# Patient Record
Sex: Female | Born: 1990 | Race: White | Hispanic: No | Marital: Single | Smoking: Never smoker
Health system: Southern US, Community
[De-identification: ages and names within clinical notes are randomized; demographics above are authoritative.]

## PROBLEM LIST (undated history)

## (undated) DIAGNOSIS — F329 Major depressive disorder, single episode, unspecified: Secondary | ICD-10-CM

## (undated) DIAGNOSIS — G43009 Migraine without aura, not intractable, without status migrainosus: Secondary | ICD-10-CM

## (undated) DIAGNOSIS — F32A Depression, unspecified: Secondary | ICD-10-CM

## (undated) DIAGNOSIS — L709 Acne, unspecified: Secondary | ICD-10-CM

## (undated) DIAGNOSIS — F419 Anxiety disorder, unspecified: Secondary | ICD-10-CM

## (undated) DIAGNOSIS — D649 Anemia, unspecified: Secondary | ICD-10-CM

## (undated) DIAGNOSIS — F410 Panic disorder [episodic paroxysmal anxiety] without agoraphobia: Secondary | ICD-10-CM

## (undated) DIAGNOSIS — R519 Headache, unspecified: Secondary | ICD-10-CM

## (undated) DIAGNOSIS — G43909 Migraine, unspecified, not intractable, without status migrainosus: Secondary | ICD-10-CM

## (undated) DIAGNOSIS — S0990XA Unspecified injury of head, initial encounter: Secondary | ICD-10-CM

## (undated) DIAGNOSIS — G479 Sleep disorder, unspecified: Secondary | ICD-10-CM

## (undated) HISTORY — DX: Migraine without aura, not intractable, without status migrainosus: G43.009

## (undated) HISTORY — DX: Depression, unspecified: F32.A

## (undated) HISTORY — DX: Acne, unspecified: L70.9

## (undated) HISTORY — DX: Major depressive disorder, single episode, unspecified: F32.9

## (undated) HISTORY — DX: Anxiety disorder, unspecified: F41.9

## (undated) HISTORY — DX: Headache, unspecified: R51.9

## (undated) HISTORY — DX: Migraine, unspecified, not intractable, without status migrainosus: G43.909

## (undated) HISTORY — DX: Anemia, unspecified: D64.9

## (undated) HISTORY — DX: Unspecified injury of head, initial encounter: S09.90XA

## (undated) HISTORY — DX: Sleep disorder, unspecified: G47.9

## (undated) HISTORY — DX: Panic disorder (episodic paroxysmal anxiety): F41.0

---
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
## 2013-08-29 ENCOUNTER — Telehealth: Payer: Self-pay | Admitting: Radiology

## 2013-08-29 ENCOUNTER — Ambulatory Visit (INDEPENDENT_AMBULATORY_CARE_PROVIDER_SITE_OTHER): Payer: BC Managed Care – HMO | Admitting: Family Medicine

## 2013-08-29 VITALS — BP 120/80 | HR 88 | Temp 98.8°F | Resp 16 | Ht 64.75 in | Wt 156.0 lb

## 2013-08-29 DIAGNOSIS — F329 Major depressive disorder, single episode, unspecified: Secondary | ICD-10-CM

## 2013-08-29 DIAGNOSIS — J029 Acute pharyngitis, unspecified: Secondary | ICD-10-CM

## 2013-08-29 DIAGNOSIS — L709 Acne, unspecified: Secondary | ICD-10-CM | POA: Insufficient documentation

## 2013-08-29 DIAGNOSIS — F32A Depression, unspecified: Secondary | ICD-10-CM | POA: Insufficient documentation

## 2013-08-29 LAB — POCT CBC
Granulocyte percent: 73.7 %G (ref 37–80)
HCT, POC: 39.3 % (ref 37.7–47.9)
Hemoglobin: 12.3 g/dL (ref 12.2–16.2)
Lymph, poc: 1.5 (ref 0.6–3.4)
MCH, POC: 27 pg (ref 27–31.2)
MCHC: 31.3 g/dL — AB (ref 31.8–35.4)
MCV: 86.3 fL (ref 80–97)
MID (cbc): 0.5 (ref 0–0.9)
MPV: 7.4 fL (ref 0–99.8)
POC Granulocyte: 5.8 (ref 2–6.9)
POC LYMPH PERCENT: 19.6 %L (ref 10–50)
POC MID %: 6.7 %M (ref 0–12)
Platelet Count, POC: 234 10*3/uL (ref 142–424)
RBC: 4.55 M/uL (ref 4.04–5.48)
RDW, POC: 14.3 %
WBC: 7.9 10*3/uL (ref 4.6–10.2)

## 2013-08-29 LAB — POCT RAPID STREP A (OFFICE): RAPID STREP A SCREEN: NEGATIVE

## 2013-08-29 MED ORDER — FIRST-DUKES MOUTHWASH MT SUSP: OROMUCOSAL | Status: DC

## 2013-08-29 NOTE — Addendum Note (Signed)
Addended by: Abbe AmsterdamOPLAND, JESSICA C on: 08/29/2013 01:13 PM   Modules accepted: Orders

## 2013-08-29 NOTE — Progress Notes (Addendum)
[facility] [address], [place] [address] [phone]  Date:  08/29/2013   Name:  Laura LambertHaelee Lucas   DOB:  09/07/1990   MRN:  [id number]  PCP:  No primary provider on file.    Chief Complaint: Sore Throat   History of Present Illness:  Laura Lucas is a 23 y.o. 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 Pt states that she woke up this morning with a swollen neck and neck pain. She describes that her neck pain as soreness. Pt states that she recently [place] to [place] vacation, and believes that her fatigue may be due to being jet lagged.  Admits she has felt drained for the last couple of weeks.   Pt is also complaining of associated congestion, cough, and fatigue. Pt denies having any sick contacts. She denies fever, emesis, or diarrhea. She currently on 40 mg Accutane 2 times a day. She is opening up her capsules and swallowing on food because she has a hard time with pills; however she has been doing this for about one month.   Pt is otherwise a generally healthy person.  There are no active problems to display for this patient.   Past Medical History  Diagnosis Date   Depression     History reviewed. No pertinent past surgical history.  History  Substance Use Topics   Smoking status: Never Smoker    Smokeless tobacco: Not on file   Alcohol Use: 0.5 oz/week    1 drink(s) per week    Family History  Problem Relation Age of Onset   Healthy Mother    Healthy Father    Thyroid disease Sister     No Known Allergies  Medication list has been reviewed and updated.  No current outpatient prescriptions on file prior to visit.   No current facility-administered medications on file prior to visit.    Review of Systems:  As per HPI, otherwise negative.  Physical Examination: Filed Vitals:   08/29/13 0905  BP: 120/80   Pulse: 88  Temp: 98.8 F (37.1 C)  Resp: 16    Body mass index is 26.15 kg/(m^2).   GEN: WDWN, NAD, Non-toxic, A & O x 3 HEENT: Atraumatic, Normocephalic. Neck supple. No masses, tender anterior and posterior LAD.   Bilateral TM wnl, oropharynx injected but no exudate or evidence of abscess.  PEERL,EOMI.   Ears and Nose: No external deformity. CV: RRR, No M/G/R. No JVD. No thrill. No extra heart sounds. PULM: CTA B, no wheezes, crackles, rhonchi. No retractions. No resp. distress. No accessory muscle use. ABD: S, NT, ND. No rebound. No HSM. EXTR: No c/c/e NEURO Normal gait.  PSYCH: Normally interactive. Conversant. Not depressed or anxious appearing.  Calm demeanor.   Results for orders placed in visit on 08/29/13  POCT RAPID STREP A (OFFICE)      Result Value Range   Rapid Strep A Screen Negative  Negative    Assessment and Plan: Acute pharyngitis - Plan: POCT rapid strep A, Throat culture ([facility]), POCT CBC, Epstein-Barr virus VCA antibody panel, Diphenhyd-Hydrocort-Nystatin (FIRST-DUKES MOUTHWASH) SUSP  Depression  Acne  10:16 AM- Advised pt of suspicion of possible Mononucleosis diagnosis. Will prescribe pt a throat wash to relieve the sore throat symptoms. Pt advised of plan for treatment and pt agrees.  See patient instructions for more details.    Signed  Abbe Amsterdam, MD  Called on 1/4 to go over her labs. LMOM that it appears she has already had mono.  I hope that she is feeling much better Results for orders placed in visit on 08/29/13  EPSTEIN-BARR VIRUS VCA ANTIBODY PANEL      Result Value Range   EBV VCA IgG 219.0 (*) <18.0 U/mL   EBV VCA IgM <10.0  <36.0 U/mL   EBV EA IgG <5.0  <9.0 U/mL   EBV NA IgG >600.0 (*) <18.0 U/mL  POCT RAPID STREP A (OFFICE)      Result Value Range   Rapid Strep A Screen Negative  Negative  POCT CBC      Result Value Range   WBC 7.9  4.6 - 10.2 K/uL   Lymph, poc 1.5  0.6 - 3.4   POC LYMPH PERCENT 19.6  10 - 50 %L    MID (cbc) 0.5  0 - 0.9   POC MID % 6.7  0 - 12 %M   POC Granulocyte 5.8  2 - 6.9   Granulocyte percent 73.7  37 - 80 %G   RBC 4.55  4.04 - 5.48 M/uL   Hemoglobin 12.3  12.2 - 16.2 g/dL   HCT, POC 16.1  09.6 - 47.9 %   MCV 86.3  80 - 97 fL   MCH, POC 27.0  27 - 31.2 pg   MCHC 31.3 (*) 31.8 - 35.4 g/dL   RDW, POC 04.5     Platelet Count, POC 234  142 - 424 K/uL   MPV 7.4  0 - 99.8 fL

## 2013-08-29 NOTE — Telephone Encounter (Signed)
Mother [place] from [place], hashimoto disease runs in the family. Mother is asking if we can add on a thyroid panel to the labs already drawn.

## 2013-08-29 NOTE — Patient Instructions (Signed)
I hope that you feel better soon!  Rest and take it easy.  You may have mono-  I will get this test back in the next few days and will give you a call.   If you are getting worse please let me know Do not take amoxicillin if there is any question of mono.

## 2013-08-30 LAB — EPSTEIN-BARR VIRUS VCA ANTIBODY PANEL
EBV NA IgG: >600 U/mL — ABNORMAL HIGH (ref <18.0)
EBV VCA IgG: 219 U/mL — ABNORMAL HIGH (ref <18.0)

## 2013-08-31 ENCOUNTER — Encounter: Payer: Self-pay | Admitting: Family Medicine

## 2013-08-31 LAB — CULTURE, GROUP A STREP: Organism ID, Bacteria: NORMAL

## 2013-09-02 ENCOUNTER — Telehealth: Payer: Self-pay

## 2013-09-02 NOTE — Telephone Encounter (Signed)
Renee/ lab: can we add on a TSH for dx fatigue? Thanks!

## 2013-09-02 NOTE — Telephone Encounter (Signed)
Called and discussed with her.  Labs are ok, looks like she already had mono.  She asked if we were able to check her thyroid- I had thought that this was done but do not see on the chart.  Will ask lab to add on a TSH if possible

## 2013-09-02 NOTE — Telephone Encounter (Signed)
See other phone message (closed by accident). [facility] with [facility] at [facility]. Test added.

## 2013-09-02 NOTE — Telephone Encounter (Signed)
Dr. Patsy Lageropland,  Patient called requesting lab results.  Please review and let us know.  Thank you, ReNee'

## 2013-09-03 ENCOUNTER — Telehealth: Payer: Self-pay | Admitting: Family Medicine

## 2013-09-03 LAB — TSH: TSH: 1.417 u[IU]/mL (ref 0.350–4.500)

## 2013-09-03 NOTE — Telephone Encounter (Signed)
Called to let her know that her TSH is normal

## 2016-12-15 ENCOUNTER — Encounter: Payer: Self-pay | Admitting: Certified Nurse Midwife

## 2017-01-25 ENCOUNTER — Encounter: Payer: Self-pay | Admitting: Certified Nurse Midwife

## 2017-03-15 DIAGNOSIS — G43009 Migraine without aura, not intractable, without status migrainosus: Secondary | ICD-10-CM | POA: Insufficient documentation

## 2017-04-10 ENCOUNTER — Encounter: Payer: Self-pay | Admitting: Pediatrics

## 2017-08-13 ENCOUNTER — Encounter: Payer: Self-pay | Admitting: Pediatrics

## 2017-10-23 ENCOUNTER — Other Ambulatory Visit: Payer: Self-pay

## 2017-10-23 ENCOUNTER — Emergency Department (HOSPITAL_BASED_OUTPATIENT_CLINIC_OR_DEPARTMENT_OTHER): Payer: BLUE CROSS/BLUE SHIELD

## 2017-10-23 ENCOUNTER — Emergency Department (HOSPITAL_BASED_OUTPATIENT_CLINIC_OR_DEPARTMENT_OTHER)
Admission: EM | Admit: 2017-10-23 | Discharge: 2017-10-24 | Disposition: A | Discharge status: Home / Self Care | Payer: BLUE CROSS/BLUE SHIELD | Attending: Emergency Medicine | Admitting: Emergency Medicine

## 2017-10-23 ENCOUNTER — Encounter (HOSPITAL_BASED_OUTPATIENT_CLINIC_OR_DEPARTMENT_OTHER): Payer: Self-pay | Admitting: *Deleted

## 2017-10-23 DIAGNOSIS — R69 Illness, unspecified: Secondary | ICD-10-CM

## 2017-10-23 DIAGNOSIS — J111 Influenza due to unidentified influenza virus with other respiratory manifestations: Secondary | ICD-10-CM

## 2017-10-23 DIAGNOSIS — R509 Fever, unspecified: Secondary | ICD-10-CM | POA: Diagnosis present

## 2017-10-23 DIAGNOSIS — Z79899 Other long term (current) drug therapy: Secondary | ICD-10-CM | POA: Diagnosis not present

## 2017-10-23 HISTORY — DX: Anemia, unspecified: D64.9

## 2017-10-23 MED ORDER — IBUPROFEN 400 MG PO TABS
400.0000 mg | ORAL_TABLET | Freq: Once | ORAL | Status: AC
  Administered 2017-10-23: 400 mg via ORAL
  Filled 2017-10-23: qty 1

## 2017-10-23 MED ORDER — IBUPROFEN 400 MG PO TABS: 400.0000 mg | ORAL_TABLET | Freq: Once | ORAL | Status: DC

## 2017-10-23 NOTE — ED Triage Notes (Signed)
Fever, cough with green sputum, chills, sore throat, body aches x 3 days.

## 2017-10-24 LAB — CBC WITH DIFFERENTIAL/PLATELET
BASOS PCT: 0 %
Basophils Absolute: 0 10*3/uL (ref 0.0–0.1)
EOS PCT: 0 %
Eosinophils Absolute: 0 10*3/uL (ref 0.0–0.7)
HEMATOCRIT: 36.4 % (ref 36.0–46.0)
Hemoglobin: 12.8 g/dL (ref 12.0–15.0)
Lymphocytes Relative: 19 %
Lymphs Abs: 0.9 10*3/uL (ref 0.7–4.0)
MCH: 30.3 pg (ref 26.0–34.0)
MCHC: 35.2 g/dL (ref 30.0–36.0)
MCV: 86.1 fL (ref 78.0–100.0)
MONOS PCT: 8 %
Monocytes Absolute: 0.4 10*3/uL (ref 0.1–1.0)
NEUTROS ABS: 3.6 10*3/uL (ref 1.7–7.7)
Neutrophils Relative %: 73 %
PLATELETS: 140 10*3/uL — AB (ref 150–400)
RBC: 4.23 MIL/uL (ref 3.87–5.11)
RDW: 12.4 % (ref 11.5–15.5)
WBC: 4.9 10*3/uL (ref 4.0–10.5)

## 2017-10-24 NOTE — ED Provider Notes (Signed)
[facility] Provider Note: Lowella Dell, MD, FACEP  CSN: [id number] MRN: [id number] ARRIVAL: 10/23/17 at 2141 ROOM: MH08/MH08   CHIEF COMPLAINT  Cough and Fever   HISTORY OF PRESENT ILLNESS  10/24/17 12:54 AM Laura Lucas is a 27 y.o. female with a 4-day history of flulike symptoms.  Specifically she has had fever to 102.4, body aches, cough productive of green sputum, chills, sore throat and nasal congestion.  She denies vomiting or diarrhea.  She has been taking TheraFlu, Mucinex "the one with everything" and Tylenol without adequate relief of her symptoms.  She is feeling weak and is concerned that she may be anemic.  She has a history of anemia and heavy menses; she is currently on her menses.  She is previously been on iron supplementation.   Past Medical History:  Diagnosis Date  . Acne   . Anemia   . Anxiety   . Common migraine   . Depression   . Major depression     History reviewed. No pertinent surgical history.  Family History  Problem Relation Age of Onset  . Healthy Mother   . Healthy Father   . Thyroid disease Sister     Social History   Tobacco Use  . Smoking status: Never Smoker  . Smokeless tobacco: Never Used  Substance Use Topics  . Alcohol use: Yes    Alcohol/week: 0.5 oz    Types: 1 Standard drinks or equivalent per week  . Drug use: No    Prior to Admission medications   Medication Sig Start Date End Date Taking? Authorizing Provider  Sertraline HCl (ZOLOFT PO) Take by mouth.   Yes [provider]  Topiramate (TOPAMAX PO) Take by mouth.   Yes [provider]  Diphenhyd-Hydrocort-Nystatin (FIRST-DUKES MOUTHWASH) SUSP Gargle and spit 5ml every 4 hours as needed. Mix 1:1 with viscous lidocaine solution 08/29/13   Copland, Gwenlyn Found, MD  FLUoxetine (PROZAC) 40 MG capsule Take 40 mg by mouth daily.    [provider]  ISOtretinoin (ACCUTANE) 40 MG capsule Take 40 mg by mouth 2 (two) times daily.     [provider]  PARAGARD INTRAUTERINE COPPER IU by Intrauterine route.    [provider]    Allergies Patient has no known allergies.   REVIEW OF SYSTEMS  Negative except as noted here or in the History of Present Illness.   PHYSICAL EXAMINATION  Initial Vital Signs Blood pressure 108/61, pulse 95, temperature 98.9 F (37.2 C), temperature source Oral, resp. rate 18, height 5\' 4"  (1.626 m), weight 68 kg (150 lb), last menstrual period 10/21/2017, SpO2 97 %.  Examination General: Well-developed, well-nourished female in no acute distress; appearance consistent with age of record HENT: normocephalic; atraumatic; nasal congestion; no pharyngeal erythema or exudate Eyes: pupils equal, round and reactive to light; extraocular muscles intact Neck: supple Heart: regular rate and rhythm Lungs: clear to auscultation bilaterally; frequent cough Abdomen: soft; nondistended; nontender; bowel sounds present Extremities: No deformity; full range of motion; pulses normal Neurologic: Awake, alert and oriented; motor function intact in all extremities and symmetric; no facial droop Skin: Warm and dry Psychiatric: Normal mood and affect   RESULTS  Summary of this visit's results, reviewed by myself:   EKG Interpretation  Date/Time:    Ventricular Rate:    PR Interval:    QRS Duration:   QT Interval:    QTC Calculation:   R Axis:     Text Interpretation:  Laboratory Studies: Results for orders placed or performed during the hospital encounter of 10/23/17 (from the past 24 hour(s))  CBC with Differential/Platelet     Status: Abnormal   Collection Time: 10/24/17  1:01 AM  Result Value Ref Range   WBC 4.9 4.0 - 10.5 K/uL   RBC 4.23 3.87 - 5.11 MIL/uL   Hemoglobin 12.8 12.0 - 15.0 g/dL   HCT 11.936.4 14.736.0 - 82.946.0 %   MCV 86.1 78.0 - 100.0 fL   MCH 30.3 26.0 - 34.0 pg   MCHC 35.2 30.0 - 36.0 g/dL   RDW 56.212.4 13.011.5 - 86.515.5 %   Platelets 140 (L) 150 - 400 K/uL    Neutrophils Relative % 73 %   Neutro Abs 3.6 1.7 - 7.7 K/uL   Lymphocytes Relative 19 %   Lymphs Abs 0.9 0.7 - 4.0 K/uL   Monocytes Relative 8 %   Monocytes Absolute 0.4 0.1 - 1.0 K/uL   Eosinophils Relative 0 %   Eosinophils Absolute 0.0 0.0 - 0.7 K/uL   Basophils Relative 0 %   Basophils Absolute 0.0 0.0 - 0.1 K/uL   Imaging Studies: Dg Chest 2 View  Result Date: 10/23/2017 CLINICAL DATA:  Sore throat and cough EXAM: CHEST  2 VIEW COMPARISON:  None. FINDINGS: The heart size and mediastinal contours are within normal limits. Both lungs are clear. The visualized skeletal structures are unremarkable. IMPRESSION: No active cardiopulmonary disease. Electronically Signed   By: Jasmine PangKim  Fujinaga M.D.   On: 10/23/2017 22:16    ED COURSE  Nursing notes and initial vitals signs, including pulse oximetry, reviewed.  Vitals:   10/23/17 2146 10/23/17 2151 10/23/17 2325 10/23/17 2359  BP:  135/90  108/61  Pulse:  (!) 124  95  Resp:  20  18  Temp:  (!) 102.4 F (39.1 C) 98.9 F (37.2 C)   TempSrc:  Oral Oral   SpO2:  100%  97%  Weight: 68 kg (150 lb)     Height: 5\' 4"  (1.626 m)      Patient symptoms consistent with flulike illness.  Chest x-ray shows no pneumonia and I do not believe antibiotics are indicated at this time.  She is outside the window for Tamiflu.  She was advised to be careful taking multiple different cough/cold/flu medications as there may be duplication of ingredients.  She was told to be especially wary of acetaminophen from multiple sources.  PROCEDURES    ED DIAGNOSES     ICD-10-CM   1. Influenza-like illness R69        Paula LibraMolpus, Dewie Ahart, MD 10/24/17 [id number]

## 2017-11-15 ENCOUNTER — Ambulatory Visit (HOSPITAL_COMMUNITY)
Admission: EM | Admit: 2017-11-15 | Discharge: 2017-11-15 | Disposition: A | Discharge status: Home / Self Care | Payer: BLUE CROSS/BLUE SHIELD | Attending: Family Medicine | Admitting: Family Medicine

## 2017-11-15 ENCOUNTER — Encounter (HOSPITAL_COMMUNITY): Payer: Self-pay | Admitting: Emergency Medicine

## 2017-11-15 ENCOUNTER — Other Ambulatory Visit: Payer: Self-pay

## 2017-11-15 ENCOUNTER — Ambulatory Visit (INDEPENDENT_AMBULATORY_CARE_PROVIDER_SITE_OTHER): Payer: BLUE CROSS/BLUE SHIELD

## 2017-11-15 DIAGNOSIS — R1012 Left upper quadrant pain: Secondary | ICD-10-CM | POA: Diagnosis not present

## 2017-11-15 DIAGNOSIS — J22 Unspecified acute lower respiratory infection: Secondary | ICD-10-CM

## 2017-11-15 DIAGNOSIS — R05 Cough: Secondary | ICD-10-CM | POA: Insufficient documentation

## 2017-11-15 DIAGNOSIS — Z79899 Other long term (current) drug therapy: Secondary | ICD-10-CM | POA: Insufficient documentation

## 2017-11-15 DIAGNOSIS — R059 Cough, unspecified: Secondary | ICD-10-CM

## 2017-11-15 LAB — LIPASE, BLOOD: Lipase: 30 U/L (ref 11–51)

## 2017-11-15 LAB — COMPREHENSIVE METABOLIC PANEL
ALT: 13 U/L — ABNORMAL LOW (ref 14–54)
ANION GAP: 8 (ref 5–15)
AST: 17 U/L (ref 15–41)
Albumin: 4.5 g/dL (ref 3.5–5.0)
Alkaline Phosphatase: 71 U/L (ref 38–126)
BILIRUBIN TOTAL: 0.7 mg/dL (ref 0.3–1.2)
BUN: 8 mg/dL (ref 6–20)
CALCIUM: 8.9 mg/dL (ref 8.9–10.3)
CO2: 25 mmol/L (ref 22–32)
Chloride: 105 mmol/L (ref 101–111)
Creatinine, Ser: 0.78 mg/dL (ref 0.44–1.00)
GFR calc Af Amer: >60 mL/min (ref >60)
Glucose, Bld: 75 mg/dL (ref 65–99)
POTASSIUM: 4.2 mmol/L (ref 3.5–5.1)
Sodium: 138 mmol/L (ref 135–145)
TOTAL PROTEIN: 7.2 g/dL (ref 6.5–8.1)

## 2017-11-15 LAB — CBC
HEMATOCRIT: 41.5 % (ref 36.0–46.0)
Hemoglobin: 14.3 g/dL (ref 12.0–15.0)
MCH: 30.1 pg (ref 26.0–34.0)
MCHC: 34.5 g/dL (ref 30.0–36.0)
MCV: 87.4 fL (ref 78.0–100.0)
Platelets: 250 10*3/uL (ref 150–400)
RBC: 4.75 MIL/uL (ref 3.87–5.11)
RDW: 13 % (ref 11.5–15.5)
WBC: 9.2 10*3/uL (ref 4.0–10.5)

## 2017-11-15 MED ORDER — AZITHROMYCIN 250 MG PO TABS: ORAL_TABLET | ORAL | 0 refills | Status: AC

## 2017-11-15 MED ORDER — NAPROXEN 500 MG PO TABS: 500.0000 mg | ORAL_TABLET | Freq: Two times a day (BID) | ORAL | 0 refills | Status: DC

## 2017-11-15 NOTE — Discharge Instructions (Addendum)
Push fluids to ensure adequate hydration and keep secretions thin.  Naproxen twice a day, take with food. Activity as tolerated. Complete course of antibiotics.  I will call you if there are any abnormal test findings.  Please follow up with your primary care doctor for recheck of symptoms in the next 1-2 weeks.  Return to be seen or go to ER if worsening of pain, nausea, vomiting, fevers, shortness of breath develops.

## 2017-11-15 NOTE — ED Triage Notes (Signed)
Pt reports LUQ pain that started about 10 days ago.

## 2017-11-15 NOTE — ED Provider Notes (Signed)
[facility]    CSN: [id number] Arrival date & time: 11/15/17  1157     History   Chief Complaint Chief Complaint  Patient presents with  . Abdominal Pain    LUQ    HPI Laura Lucas is a 27 y.o. female.   Laura Lucas presents with complaints of LUQ pain which has been ongoing for the past two weeks, worsened today. Had flu like illness, was treated with medrol dose pack and tessalon which have not helped with symptoms. Mild improvement of left side pain but it was worse last night. Last medrol yesterday. Movement worsens the pain and laying on her side worsens the pain. She otherwise feels well. Denies shortness of breath, fevers, congestion, leg pain or swelling, recent travel. Does not smoke, is not on oral birth control. Eating and drinking. States her job is physical and she does a lot of lifting of boxes etc. Took ibuprofen last 2 hours ago which minimally helped. History of anxiety, depression, migraines. Negative chest xray 3/15.     ROS per HPI.      Past Medical History:  Diagnosis Date  . Acne   . Anemia   . Anxiety   . Common migraine   . Depression   . Major depression     Patient Active Problem List   Diagnosis Date Noted  . Depression 08/29/2013  . Acne 08/29/2013    History reviewed. No pertinent surgical history.  OB History   None      Home Medications    Prior to Admission medications   Medication Sig Start Date End Date Taking? Authorizing Provider  Sertraline HCl (ZOLOFT PO) Take by mouth.   Yes [provider]  Topiramate (TOPAMAX PO) Take by mouth.   Yes [provider]  azithromycin (ZITHROMAX) 250 MG tablet Take 2 tablets (500 mg total) by mouth daily for 1 day, THEN 1 tablet (250 mg total) daily for 4 days. 11/15/17 11/20/17  Georgetta Haber, NP  naproxen (NAPROSYN) 500 MG tablet Take 1 tablet (500 mg total) by mouth 2 (two) times daily. 11/15/17   Georgetta Haber, NP    Family History Family History    Problem Relation Age of Onset  . Healthy Mother   . Healthy Father   . Thyroid disease Sister     Social History Social History   Tobacco Use  . Smoking status: Never Smoker  . Smokeless tobacco: Never Used  Substance Use Topics  . Alcohol use: Yes    Alcohol/week: 0.5 oz    Types: 1 Standard drinks or equivalent per week  . Drug use: No     Allergies   Patient has no known allergies.   Review of Systems Review of Systems   Physical Exam Triage Vital Signs ED Triage Vitals  Enc Vitals Group     BP 11/15/17 1248 118/70     Pulse Rate 11/15/17 1248 93     Resp --      Temp 11/15/17 1248 98.1 F (36.7 C)     Temp Source 11/15/17 1248 Oral     SpO2 11/15/17 1248 100 %     Weight --      Height --      Head Circumference --      Peak Flow --      Pain Score 11/15/17 1246 3     Pain Loc --      Pain Edu? --      Excl. in GC? --  No data found.  Updated Vital Signs BP 118/70 (BP Location: Left Arm)   Pulse 93   Temp 98.1 F (36.7 C) (Oral)   LMP 10/21/2017 (Exact Date)   SpO2 100%   Visual Acuity Right Eye Distance:   Left Eye Distance:   Bilateral Distance:    Right Eye Near:   Left Eye Near:    Bilateral Near:     Physical Exam  Constitutional: She is oriented to person, place, and time. She appears well-developed and well-nourished. No distress.  HENT:  Head: Normocephalic and atraumatic.  Right Ear: Tympanic membrane, external ear and ear canal normal.  Left Ear: Tympanic membrane, external ear and ear canal normal.  Nose: Nose normal.  Mouth/Throat: Uvula is midline, oropharynx is clear and moist and mucous membranes are normal. No tonsillar exudate.  Eyes: Pupils are equal, round, and reactive to light. Conjunctivae and EOM are normal.  Cardiovascular: Normal rate, regular rhythm and normal heart sounds.  Pulmonary/Chest: Effort normal and breath sounds normal.  Frequent dry cough noted throughout exam   Abdominal: There is tenderness  in the left upper quadrant.  Quite significant tenderness to LUQ and to left flank; without rib tenderness   Neurological: She is alert and oriented to person, place, and time.  Skin: Skin is warm and dry.     UC Treatments / Results  Labs (all labs ordered are listed, but only abnormal results are displayed) Labs Reviewed  CBC  COMPREHENSIVE METABOLIC PANEL  LIPASE, BLOOD    EKG  EKG Interpretation None       Radiology Dg Chest 2 View  Result Date: 11/15/2017 CLINICAL DATA:  LEFT-sided chest pain EXAM: CHEST - 2 VIEW COMPARISON:  11/09/2017. FINDINGS: Normal heart size.  Clear lung fields.  No bony abnormality. IMPRESSION: No active cardiopulmonary disease.  Stable appearance from priors Electronically Signed   By: Elsie StainJohn T Curnes M.D.   On: 11/15/2017 14:07    Procedures Procedures (including critical care time)  Medications Ordered in UC Medications - No data to display   Initial Impression / Assessment and Plan / UC Course  I have reviewed the triage vital signs and the nursing notes.  Pertinent labs & imaging results that were available during my care of the patient were reviewed by me and considered in my medical decision making (see chart for details).     Chest xray without acute findings. Persistent cough noted, will cover with azithromycin at this time. Just completed medrol pack. Naproxen twice a day. Pain reproducible with palpation and movement. Without shortness of breath , hypoxia, tachypnea, tachycardia. Without risk factors for PE. Basic labs and lipase pending, will call patient if any abnormal findings. To continue to follow with PCP if symptoms persist. Return precautions provided.. Patient verbalized understanding and agreeable to plan.    Final Clinical Impressions(s) / UC Diagnoses   Final diagnoses:  Abdominal pain, LUQ  Cough  Lower respiratory tract infection    ED Discharge Orders        Ordered    azithromycin (ZITHROMAX) 250 MG tablet      11/15/17 1411    naproxen (NAPROSYN) 500 MG tablet  2 times daily     11/15/17 1411       Controlled Substance Prescriptions [place] Controlled Substance Registry consulted? Not Applicable   Georgetta HaberBurky, Candita Borenstein B, NP 11/15/17 1415

## 2019-01-16 DIAGNOSIS — F3342 Major depressive disorder, recurrent, in full remission: Secondary | ICD-10-CM | POA: Diagnosis not present

## 2019-01-16 DIAGNOSIS — F419 Anxiety disorder, unspecified: Secondary | ICD-10-CM | POA: Diagnosis not present

## 2019-01-17 DIAGNOSIS — Z01419 Encounter for gynecological examination (general) (routine) without abnormal findings: Secondary | ICD-10-CM | POA: Diagnosis not present

## 2019-01-17 DIAGNOSIS — Z6826 Body mass index (BMI) 26.0-26.9, adult: Secondary | ICD-10-CM | POA: Diagnosis not present

## 2019-01-17 DIAGNOSIS — Z113 Encounter for screening for infections with a predominantly sexual mode of transmission: Secondary | ICD-10-CM | POA: Diagnosis not present

## 2019-01-28 DIAGNOSIS — L905 Scar conditions and fibrosis of skin: Secondary | ICD-10-CM | POA: Diagnosis not present

## 2019-01-28 DIAGNOSIS — D225 Melanocytic nevi of trunk: Secondary | ICD-10-CM | POA: Diagnosis not present

## 2019-01-28 DIAGNOSIS — B360 Pityriasis versicolor: Secondary | ICD-10-CM | POA: Diagnosis not present

## 2019-01-28 DIAGNOSIS — L814 Other melanin hyperpigmentation: Secondary | ICD-10-CM | POA: Diagnosis not present

## 2019-03-31 DIAGNOSIS — F331 Major depressive disorder, recurrent, moderate: Secondary | ICD-10-CM | POA: Diagnosis not present

## 2019-04-02 DIAGNOSIS — G43839 Menstrual migraine, intractable, without status migrainosus: Secondary | ICD-10-CM | POA: Diagnosis not present

## 2019-04-02 DIAGNOSIS — G43719 Chronic migraine without aura, intractable, without status migrainosus: Secondary | ICD-10-CM | POA: Diagnosis not present

## 2019-04-02 DIAGNOSIS — Z049 Encounter for examination and observation for unspecified reason: Secondary | ICD-10-CM | POA: Diagnosis not present

## 2019-04-07 DIAGNOSIS — F331 Major depressive disorder, recurrent, moderate: Secondary | ICD-10-CM | POA: Diagnosis not present

## 2019-04-14 DIAGNOSIS — F3162 Bipolar disorder, current episode mixed, moderate: Secondary | ICD-10-CM | POA: Diagnosis not present

## 2019-04-16 DIAGNOSIS — M542 Cervicalgia: Secondary | ICD-10-CM | POA: Diagnosis not present

## 2019-04-16 DIAGNOSIS — G43839 Menstrual migraine, intractable, without status migrainosus: Secondary | ICD-10-CM | POA: Diagnosis not present

## 2019-04-16 DIAGNOSIS — G43719 Chronic migraine without aura, intractable, without status migrainosus: Secondary | ICD-10-CM | POA: Diagnosis not present

## 2019-04-22 DIAGNOSIS — F331 Major depressive disorder, recurrent, moderate: Secondary | ICD-10-CM | POA: Diagnosis not present

## 2019-04-29 DIAGNOSIS — F331 Major depressive disorder, recurrent, moderate: Secondary | ICD-10-CM | POA: Diagnosis not present

## 2019-04-30 DIAGNOSIS — G43719 Chronic migraine without aura, intractable, without status migrainosus: Secondary | ICD-10-CM | POA: Diagnosis not present

## 2019-04-30 DIAGNOSIS — G43839 Menstrual migraine, intractable, without status migrainosus: Secondary | ICD-10-CM | POA: Diagnosis not present

## 2019-04-30 DIAGNOSIS — M542 Cervicalgia: Secondary | ICD-10-CM | POA: Diagnosis not present

## 2019-05-20 DIAGNOSIS — F331 Major depressive disorder, recurrent, moderate: Secondary | ICD-10-CM | POA: Diagnosis not present

## 2019-06-10 DIAGNOSIS — F331 Major depressive disorder, recurrent, moderate: Secondary | ICD-10-CM | POA: Diagnosis not present

## 2019-06-25 DIAGNOSIS — L700 Acne vulgaris: Secondary | ICD-10-CM | POA: Diagnosis not present

## 2019-07-01 DIAGNOSIS — G43839 Menstrual migraine, intractable, without status migrainosus: Secondary | ICD-10-CM | POA: Diagnosis not present

## 2019-07-01 DIAGNOSIS — G43719 Chronic migraine without aura, intractable, without status migrainosus: Secondary | ICD-10-CM | POA: Diagnosis not present

## 2019-07-01 DIAGNOSIS — M542 Cervicalgia: Secondary | ICD-10-CM | POA: Diagnosis not present

## 2019-07-02 DIAGNOSIS — F331 Major depressive disorder, recurrent, moderate: Secondary | ICD-10-CM | POA: Diagnosis not present

## 2019-07-03 DIAGNOSIS — Z20828 Contact with and (suspected) exposure to other viral communicable diseases: Secondary | ICD-10-CM | POA: Diagnosis not present

## 2019-07-16 DIAGNOSIS — G43839 Menstrual migraine, intractable, without status migrainosus: Secondary | ICD-10-CM | POA: Diagnosis not present

## 2019-07-16 DIAGNOSIS — M542 Cervicalgia: Secondary | ICD-10-CM | POA: Diagnosis not present

## 2019-07-16 DIAGNOSIS — G43719 Chronic migraine without aura, intractable, without status migrainosus: Secondary | ICD-10-CM | POA: Diagnosis not present

## 2019-07-17 DIAGNOSIS — F331 Major depressive disorder, recurrent, moderate: Secondary | ICD-10-CM | POA: Diagnosis not present

## 2019-07-24 IMAGING — CR DG CHEST 2V
2 series · 2 of 2 positions shown · non-contrast
Comparison: None.

CLINICAL DATA: Sore throat and cough

EXAM:
CHEST  2 VIEW

[w chest lat]
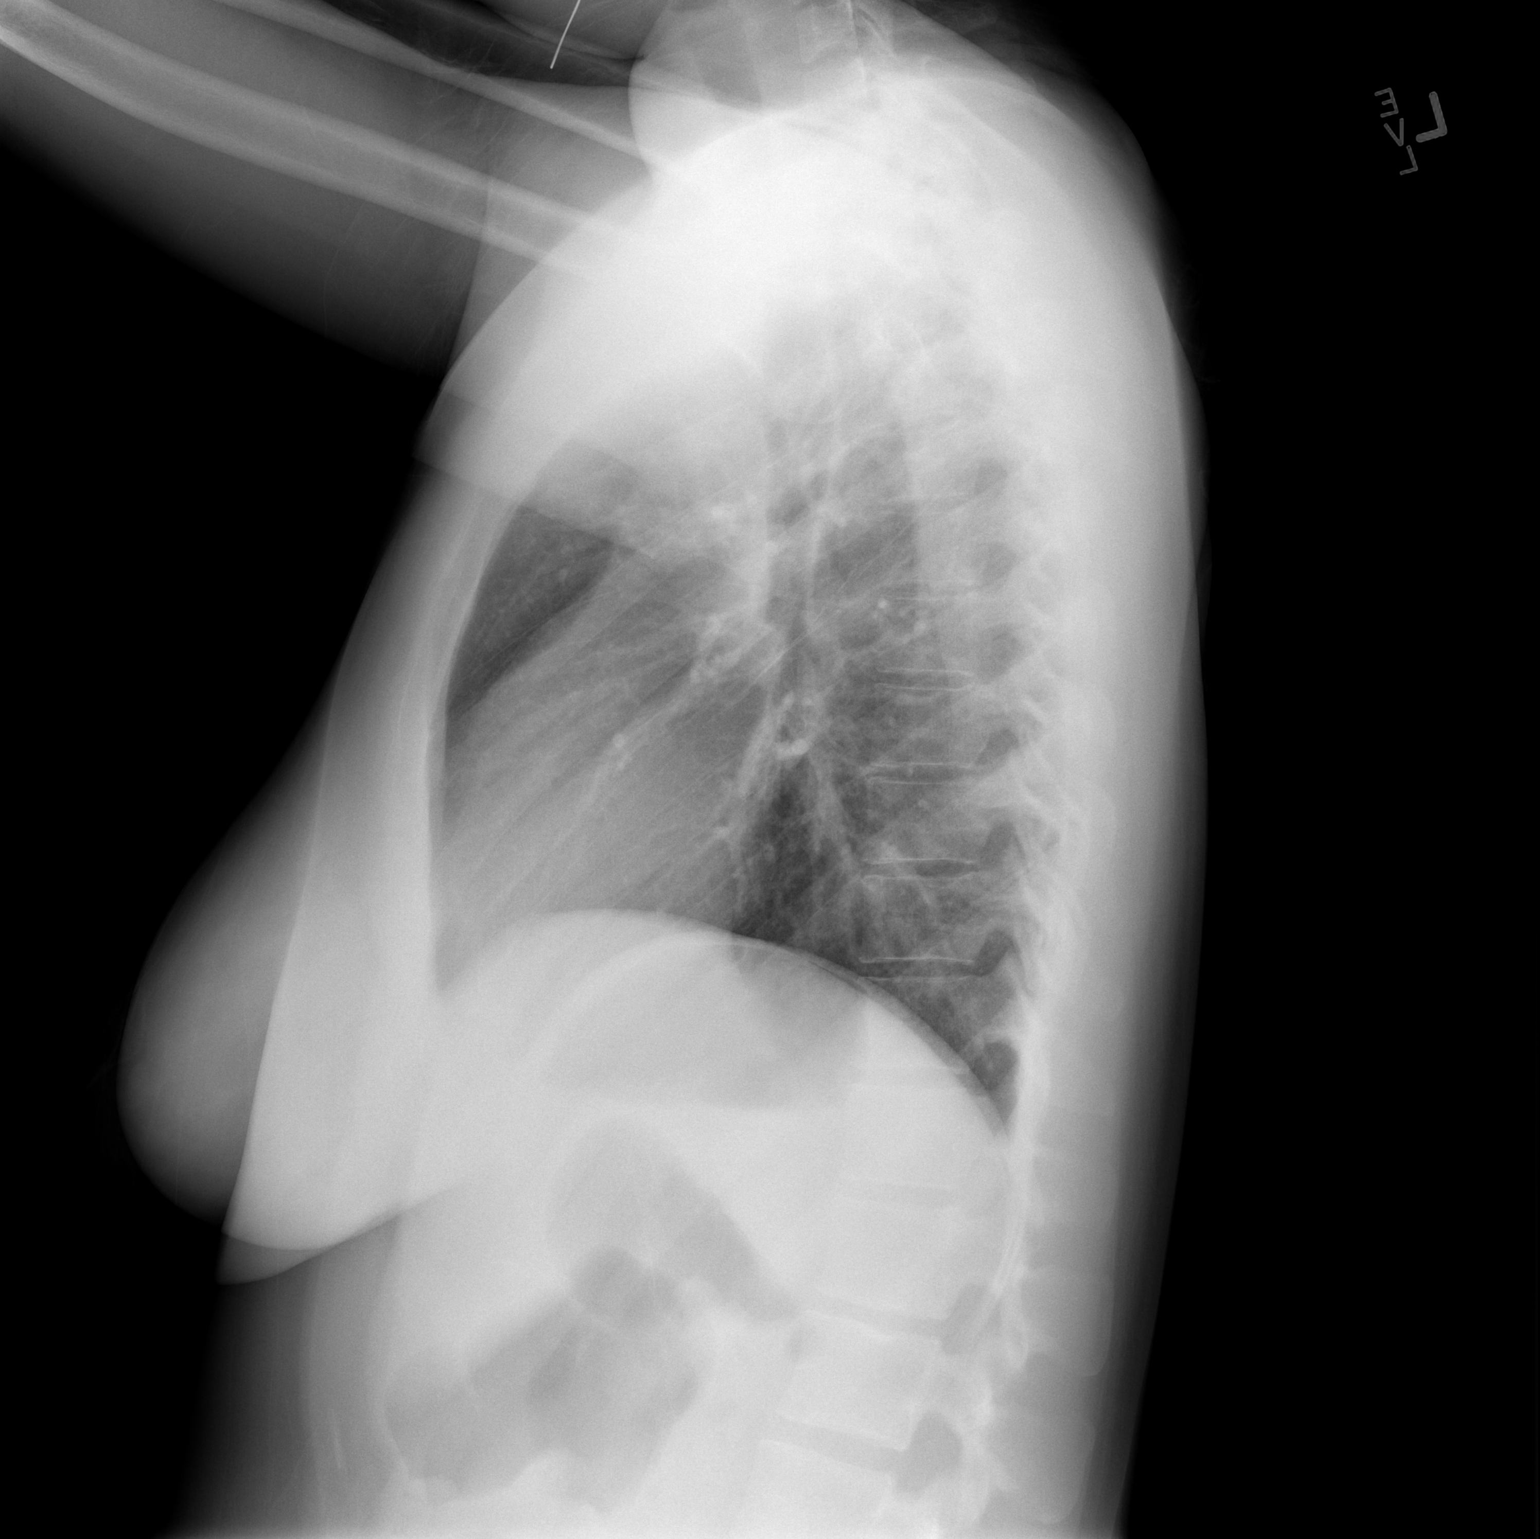

[w chest pa]
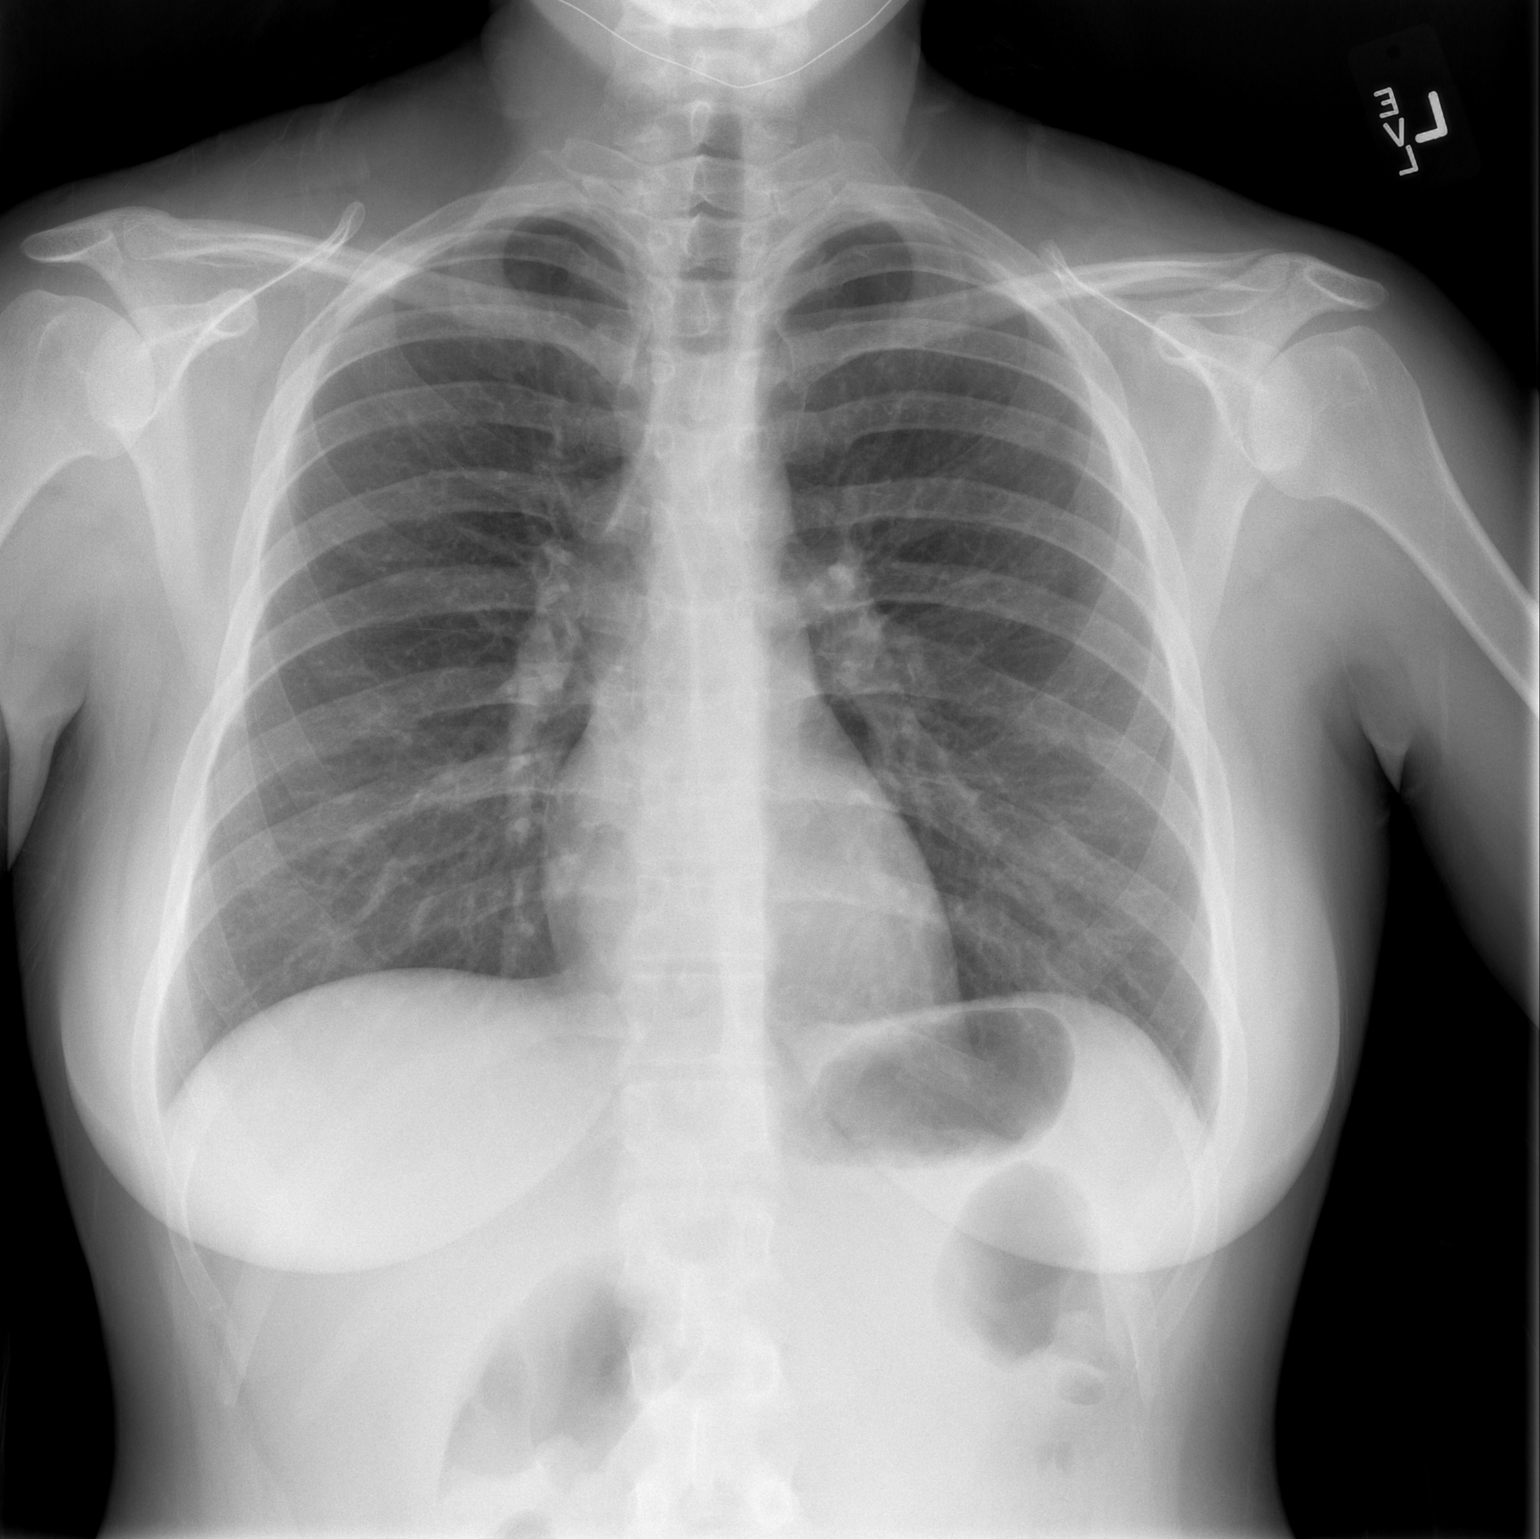

[2 of 2 positions shown; findings below may reference images not displayed]

FINDINGS: The heart size and mediastinal contours are within normal limits.
Both lungs are clear. The visualized skeletal structures are
unremarkable.
IMPRESSION: No active cardiopulmonary disease.

## 2019-08-07 DIAGNOSIS — F331 Major depressive disorder, recurrent, moderate: Secondary | ICD-10-CM | POA: Diagnosis not present

## 2019-08-13 DIAGNOSIS — Z20828 Contact with and (suspected) exposure to other viral communicable diseases: Secondary | ICD-10-CM | POA: Diagnosis not present

## 2019-08-19 DIAGNOSIS — Z20828 Contact with and (suspected) exposure to other viral communicable diseases: Secondary | ICD-10-CM | POA: Diagnosis not present

## 2019-09-03 DIAGNOSIS — R109 Unspecified abdominal pain: Secondary | ICD-10-CM | POA: Diagnosis not present

## 2019-09-03 DIAGNOSIS — R1032 Left lower quadrant pain: Secondary | ICD-10-CM | POA: Diagnosis not present

## 2019-09-03 DIAGNOSIS — K921 Melena: Secondary | ICD-10-CM | POA: Diagnosis not present

## 2019-09-04 DIAGNOSIS — R109 Unspecified abdominal pain: Secondary | ICD-10-CM | POA: Diagnosis not present

## 2019-09-04 DIAGNOSIS — K921 Melena: Secondary | ICD-10-CM | POA: Diagnosis not present

## 2019-09-04 DIAGNOSIS — R1030 Lower abdominal pain, unspecified: Secondary | ICD-10-CM | POA: Diagnosis not present

## 2019-09-04 DIAGNOSIS — R1032 Left lower quadrant pain: Secondary | ICD-10-CM | POA: Diagnosis not present

## 2019-09-11 DIAGNOSIS — F331 Major depressive disorder, recurrent, moderate: Secondary | ICD-10-CM | POA: Diagnosis not present

## 2019-09-15 DIAGNOSIS — G43719 Chronic migraine without aura, intractable, without status migrainosus: Secondary | ICD-10-CM | POA: Diagnosis not present

## 2019-09-15 DIAGNOSIS — M542 Cervicalgia: Secondary | ICD-10-CM | POA: Diagnosis not present

## 2019-09-15 DIAGNOSIS — G43839 Menstrual migraine, intractable, without status migrainosus: Secondary | ICD-10-CM | POA: Diagnosis not present

## 2019-09-18 DIAGNOSIS — L700 Acne vulgaris: Secondary | ICD-10-CM | POA: Diagnosis not present

## 2019-09-23 DIAGNOSIS — N12 Tubulo-interstitial nephritis, not specified as acute or chronic: Secondary | ICD-10-CM | POA: Diagnosis not present

## 2019-09-25 DIAGNOSIS — F331 Major depressive disorder, recurrent, moderate: Secondary | ICD-10-CM | POA: Diagnosis not present

## 2019-10-06 DIAGNOSIS — L738 Other specified follicular disorders: Secondary | ICD-10-CM | POA: Diagnosis not present

## 2019-10-08 DIAGNOSIS — G43839 Menstrual migraine, intractable, without status migrainosus: Secondary | ICD-10-CM | POA: Diagnosis not present

## 2019-10-08 DIAGNOSIS — M542 Cervicalgia: Secondary | ICD-10-CM | POA: Diagnosis not present

## 2019-10-08 DIAGNOSIS — G43719 Chronic migraine without aura, intractable, without status migrainosus: Secondary | ICD-10-CM | POA: Diagnosis not present

## 2019-10-10 DIAGNOSIS — F3281 Premenstrual dysphoric disorder: Secondary | ICD-10-CM | POA: Diagnosis not present

## 2019-10-16 DIAGNOSIS — F331 Major depressive disorder, recurrent, moderate: Secondary | ICD-10-CM | POA: Diagnosis not present

## 2019-11-06 DIAGNOSIS — F331 Major depressive disorder, recurrent, moderate: Secondary | ICD-10-CM | POA: Diagnosis not present

## 2019-11-21 DIAGNOSIS — R599 Enlarged lymph nodes, unspecified: Secondary | ICD-10-CM | POA: Diagnosis not present

## 2019-11-21 DIAGNOSIS — G479 Sleep disorder, unspecified: Secondary | ICD-10-CM | POA: Diagnosis not present

## 2019-11-21 DIAGNOSIS — R61 Generalized hyperhidrosis: Secondary | ICD-10-CM | POA: Diagnosis not present

## 2019-11-24 DIAGNOSIS — G43839 Menstrual migraine, intractable, without status migrainosus: Secondary | ICD-10-CM | POA: Diagnosis not present

## 2019-11-24 DIAGNOSIS — G43719 Chronic migraine without aura, intractable, without status migrainosus: Secondary | ICD-10-CM | POA: Diagnosis not present

## 2019-11-27 DIAGNOSIS — F331 Major depressive disorder, recurrent, moderate: Secondary | ICD-10-CM | POA: Diagnosis not present

## 2019-12-16 DIAGNOSIS — F331 Major depressive disorder, recurrent, moderate: Secondary | ICD-10-CM | POA: Diagnosis not present

## 2019-12-25 DIAGNOSIS — F4310 Post-traumatic stress disorder, unspecified: Secondary | ICD-10-CM | POA: Diagnosis not present

## 2019-12-25 DIAGNOSIS — F331 Major depressive disorder, recurrent, moderate: Secondary | ICD-10-CM | POA: Diagnosis not present

## 2019-12-25 DIAGNOSIS — F419 Anxiety disorder, unspecified: Secondary | ICD-10-CM | POA: Diagnosis not present

## 2020-01-01 DIAGNOSIS — F411 Generalized anxiety disorder: Secondary | ICD-10-CM | POA: Diagnosis not present

## 2020-01-08 DIAGNOSIS — F331 Major depressive disorder, recurrent, moderate: Secondary | ICD-10-CM | POA: Diagnosis not present

## 2020-01-09 DIAGNOSIS — F411 Generalized anxiety disorder: Secondary | ICD-10-CM | POA: Diagnosis not present

## 2020-01-15 DIAGNOSIS — F411 Generalized anxiety disorder: Secondary | ICD-10-CM | POA: Diagnosis not present

## 2020-01-16 DIAGNOSIS — F4310 Post-traumatic stress disorder, unspecified: Secondary | ICD-10-CM | POA: Diagnosis not present

## 2020-01-16 DIAGNOSIS — F3341 Major depressive disorder, recurrent, in partial remission: Secondary | ICD-10-CM | POA: Diagnosis not present

## 2020-01-16 DIAGNOSIS — F419 Anxiety disorder, unspecified: Secondary | ICD-10-CM | POA: Diagnosis not present

## 2020-01-19 DIAGNOSIS — R61 Generalized hyperhidrosis: Secondary | ICD-10-CM | POA: Diagnosis not present

## 2020-01-19 DIAGNOSIS — Z6825 Body mass index (BMI) 25.0-25.9, adult: Secondary | ICD-10-CM | POA: Diagnosis not present

## 2020-01-19 DIAGNOSIS — Z01419 Encounter for gynecological examination (general) (routine) without abnormal findings: Secondary | ICD-10-CM | POA: Diagnosis not present

## 2020-01-21 DIAGNOSIS — F331 Major depressive disorder, recurrent, moderate: Secondary | ICD-10-CM | POA: Diagnosis not present

## 2020-01-21 DIAGNOSIS — F410 Panic disorder [episodic paroxysmal anxiety] without agoraphobia: Secondary | ICD-10-CM | POA: Diagnosis not present

## 2020-01-30 DIAGNOSIS — F411 Generalized anxiety disorder: Secondary | ICD-10-CM | POA: Diagnosis not present

## 2020-02-19 DIAGNOSIS — F411 Generalized anxiety disorder: Secondary | ICD-10-CM | POA: Diagnosis not present

## 2020-03-12 DIAGNOSIS — F411 Generalized anxiety disorder: Secondary | ICD-10-CM | POA: Diagnosis not present

## 2020-03-15 DIAGNOSIS — M542 Cervicalgia: Secondary | ICD-10-CM | POA: Diagnosis not present

## 2020-03-15 DIAGNOSIS — G43839 Menstrual migraine, intractable, without status migrainosus: Secondary | ICD-10-CM | POA: Diagnosis not present

## 2020-03-15 DIAGNOSIS — G43719 Chronic migraine without aura, intractable, without status migrainosus: Secondary | ICD-10-CM | POA: Diagnosis not present

## 2020-03-22 DIAGNOSIS — D225 Melanocytic nevi of trunk: Secondary | ICD-10-CM | POA: Diagnosis not present

## 2020-03-22 DIAGNOSIS — D229 Melanocytic nevi, unspecified: Secondary | ICD-10-CM | POA: Diagnosis not present

## 2020-03-22 DIAGNOSIS — L821 Other seborrheic keratosis: Secondary | ICD-10-CM | POA: Diagnosis not present

## 2020-03-26 DIAGNOSIS — F411 Generalized anxiety disorder: Secondary | ICD-10-CM | POA: Diagnosis not present

## 2020-04-06 DIAGNOSIS — F3341 Major depressive disorder, recurrent, in partial remission: Secondary | ICD-10-CM | POA: Diagnosis not present

## 2020-04-07 DIAGNOSIS — F419 Anxiety disorder, unspecified: Secondary | ICD-10-CM | POA: Diagnosis not present

## 2020-04-07 DIAGNOSIS — R61 Generalized hyperhidrosis: Secondary | ICD-10-CM | POA: Diagnosis not present

## 2020-04-07 DIAGNOSIS — F339 Major depressive disorder, recurrent, unspecified: Secondary | ICD-10-CM | POA: Diagnosis not present

## 2020-04-09 DIAGNOSIS — F411 Generalized anxiety disorder: Secondary | ICD-10-CM | POA: Diagnosis not present

## 2020-04-27 DIAGNOSIS — J029 Acute pharyngitis, unspecified: Secondary | ICD-10-CM | POA: Diagnosis not present

## 2020-05-06 DIAGNOSIS — F411 Generalized anxiety disorder: Secondary | ICD-10-CM | POA: Diagnosis not present

## 2020-05-13 DIAGNOSIS — F3342 Major depressive disorder, recurrent, in full remission: Secondary | ICD-10-CM | POA: Diagnosis not present

## 2020-05-13 DIAGNOSIS — F4310 Post-traumatic stress disorder, unspecified: Secondary | ICD-10-CM | POA: Diagnosis not present

## 2020-05-21 DIAGNOSIS — F411 Generalized anxiety disorder: Secondary | ICD-10-CM | POA: Diagnosis not present

## 2020-06-03 DIAGNOSIS — F411 Generalized anxiety disorder: Secondary | ICD-10-CM | POA: Diagnosis not present

## 2020-06-08 DIAGNOSIS — J309 Allergic rhinitis, unspecified: Secondary | ICD-10-CM | POA: Diagnosis not present

## 2020-06-08 DIAGNOSIS — H109 Unspecified conjunctivitis: Secondary | ICD-10-CM | POA: Diagnosis not present

## 2020-06-14 DIAGNOSIS — G43719 Chronic migraine without aura, intractable, without status migrainosus: Secondary | ICD-10-CM | POA: Diagnosis not present

## 2020-06-14 DIAGNOSIS — G43839 Menstrual migraine, intractable, without status migrainosus: Secondary | ICD-10-CM | POA: Diagnosis not present

## 2020-06-14 DIAGNOSIS — M542 Cervicalgia: Secondary | ICD-10-CM | POA: Diagnosis not present

## 2020-06-15 DIAGNOSIS — F411 Generalized anxiety disorder: Secondary | ICD-10-CM | POA: Diagnosis not present

## 2020-06-24 DIAGNOSIS — F411 Generalized anxiety disorder: Secondary | ICD-10-CM | POA: Diagnosis not present

## 2020-07-05 DIAGNOSIS — F411 Generalized anxiety disorder: Secondary | ICD-10-CM | POA: Diagnosis not present

## 2020-07-07 DIAGNOSIS — R42 Dizziness and giddiness: Secondary | ICD-10-CM | POA: Diagnosis not present

## 2020-07-07 DIAGNOSIS — M26601 Right temporomandibular joint disorder, unspecified: Secondary | ICD-10-CM | POA: Diagnosis not present

## 2020-07-07 DIAGNOSIS — J0100 Acute maxillary sinusitis, unspecified: Secondary | ICD-10-CM | POA: Diagnosis not present

## 2020-07-16 DIAGNOSIS — F411 Generalized anxiety disorder: Secondary | ICD-10-CM | POA: Diagnosis not present

## 2020-07-29 DIAGNOSIS — F411 Generalized anxiety disorder: Secondary | ICD-10-CM | POA: Diagnosis not present

## 2020-08-03 DIAGNOSIS — Z0000 Encounter for general adult medical examination without abnormal findings: Secondary | ICD-10-CM | POA: Diagnosis not present

## 2020-08-03 DIAGNOSIS — Z23 Encounter for immunization: Secondary | ICD-10-CM | POA: Diagnosis not present

## 2020-08-03 DIAGNOSIS — R42 Dizziness and giddiness: Secondary | ICD-10-CM | POA: Diagnosis not present

## 2020-08-03 DIAGNOSIS — Z13220 Encounter for screening for lipoid disorders: Secondary | ICD-10-CM | POA: Diagnosis not present

## 2020-08-03 DIAGNOSIS — Z8349 Family history of other endocrine, nutritional and metabolic diseases: Secondary | ICD-10-CM | POA: Diagnosis not present

## 2020-08-09 DIAGNOSIS — F3281 Premenstrual dysphoric disorder: Secondary | ICD-10-CM | POA: Diagnosis not present

## 2020-08-09 DIAGNOSIS — F4310 Post-traumatic stress disorder, unspecified: Secondary | ICD-10-CM | POA: Diagnosis not present

## 2020-08-09 DIAGNOSIS — F419 Anxiety disorder, unspecified: Secondary | ICD-10-CM | POA: Diagnosis not present

## 2020-08-09 DIAGNOSIS — F330 Major depressive disorder, recurrent, mild: Secondary | ICD-10-CM | POA: Diagnosis not present

## 2020-08-12 DIAGNOSIS — F411 Generalized anxiety disorder: Secondary | ICD-10-CM | POA: Diagnosis not present

## 2020-09-03 DIAGNOSIS — F411 Generalized anxiety disorder: Secondary | ICD-10-CM | POA: Diagnosis not present

## 2020-09-06 DIAGNOSIS — F419 Anxiety disorder, unspecified: Secondary | ICD-10-CM | POA: Diagnosis not present

## 2020-09-06 DIAGNOSIS — F332 Major depressive disorder, recurrent severe without psychotic features: Secondary | ICD-10-CM | POA: Diagnosis not present

## 2020-09-06 DIAGNOSIS — F4310 Post-traumatic stress disorder, unspecified: Secondary | ICD-10-CM | POA: Diagnosis not present

## 2020-09-13 DIAGNOSIS — F411 Generalized anxiety disorder: Secondary | ICD-10-CM | POA: Diagnosis not present

## 2020-10-13 DIAGNOSIS — R350 Frequency of micturition: Secondary | ICD-10-CM | POA: Diagnosis not present

## 2021-06-07 ENCOUNTER — Ambulatory Visit (HOSPITAL_COMMUNITY)
Admission: EM | Admit: 2021-06-07 | Discharge: 2021-06-08 | Disposition: A | Discharge status: Home / Self Care | Payer: 59 | Attending: Family | Admitting: Family

## 2021-06-07 ENCOUNTER — Other Ambulatory Visit: Payer: Self-pay

## 2021-06-07 DIAGNOSIS — G4700 Insomnia, unspecified: Secondary | ICD-10-CM | POA: Insufficient documentation

## 2021-06-07 DIAGNOSIS — F419 Anxiety disorder, unspecified: Secondary | ICD-10-CM | POA: Insufficient documentation

## 2021-06-07 DIAGNOSIS — F332 Major depressive disorder, recurrent severe without psychotic features: Secondary | ICD-10-CM | POA: Diagnosis not present

## 2021-06-07 DIAGNOSIS — Z79899 Other long term (current) drug therapy: Secondary | ICD-10-CM | POA: Insufficient documentation

## 2021-06-07 DIAGNOSIS — Z634 Disappearance and death of family member: Secondary | ICD-10-CM | POA: Insufficient documentation

## 2021-06-07 DIAGNOSIS — R45851 Suicidal ideations: Secondary | ICD-10-CM | POA: Insufficient documentation

## 2021-06-07 DIAGNOSIS — Z9151 Personal history of suicidal behavior: Secondary | ICD-10-CM | POA: Insufficient documentation

## 2021-06-07 DIAGNOSIS — F4310 Post-traumatic stress disorder, unspecified: Secondary | ICD-10-CM | POA: Insufficient documentation

## 2021-06-07 DIAGNOSIS — G43909 Migraine, unspecified, not intractable, without status migrainosus: Secondary | ICD-10-CM | POA: Insufficient documentation

## 2021-06-07 DIAGNOSIS — Z20822 Contact with and (suspected) exposure to covid-19: Secondary | ICD-10-CM | POA: Insufficient documentation

## 2021-06-07 DIAGNOSIS — T424X2A Poisoning by benzodiazepines, intentional self-harm, initial encounter: Secondary | ICD-10-CM | POA: Insufficient documentation

## 2021-06-07 DIAGNOSIS — T428X2A Poisoning by antiparkinsonism drugs and other central muscle-tone depressants, intentional self-harm, initial encounter: Secondary | ICD-10-CM | POA: Insufficient documentation
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
## 2021-06-08 ENCOUNTER — Inpatient Hospital Stay (HOSPITAL_COMMUNITY)
Admission: RE | Admit: 2021-06-08 | Discharge: 2021-06-10 | DRG: 885 | Disposition: A | Discharge status: Home / Self Care | Payer: 59 | Source: Intra-hospital | Attending: Emergency Medicine | Admitting: Emergency Medicine

## 2021-06-08 ENCOUNTER — Encounter (HOSPITAL_COMMUNITY): Payer: Self-pay | Admitting: Psychiatry

## 2021-06-08 DIAGNOSIS — R45851 Suicidal ideations: Secondary | ICD-10-CM | POA: Diagnosis present

## 2021-06-08 DIAGNOSIS — F332 Major depressive disorder, recurrent severe without psychotic features: Principal | ICD-10-CM | POA: Diagnosis present

## 2021-06-08 DIAGNOSIS — G4700 Insomnia, unspecified: Secondary | ICD-10-CM | POA: Diagnosis present

## 2021-06-08 DIAGNOSIS — T428X2A Poisoning by antiparkinsonism drugs and other central muscle-tone depressants, intentional self-harm, initial encounter: Secondary | ICD-10-CM | POA: Diagnosis not present

## 2021-06-08 DIAGNOSIS — Z9151 Personal history of suicidal behavior: Secondary | ICD-10-CM | POA: Diagnosis not present

## 2021-06-08 DIAGNOSIS — K5900 Constipation, unspecified: Secondary | ICD-10-CM | POA: Diagnosis present

## 2021-06-08 DIAGNOSIS — F411 Generalized anxiety disorder: Secondary | ICD-10-CM | POA: Diagnosis present

## 2021-06-08 DIAGNOSIS — F419 Anxiety disorder, unspecified: Secondary | ICD-10-CM | POA: Diagnosis not present

## 2021-06-08 DIAGNOSIS — Z79899 Other long term (current) drug therapy: Secondary | ICD-10-CM | POA: Diagnosis not present

## 2021-06-08 DIAGNOSIS — Z87820 Personal history of traumatic brain injury: Secondary | ICD-10-CM

## 2021-06-08 DIAGNOSIS — Z20822 Contact with and (suspected) exposure to covid-19: Secondary | ICD-10-CM | POA: Diagnosis present

## 2021-06-08 DIAGNOSIS — F4310 Post-traumatic stress disorder, unspecified: Secondary | ICD-10-CM | POA: Diagnosis present

## 2021-06-08 DIAGNOSIS — Z975 Presence of (intrauterine) contraceptive device: Secondary | ICD-10-CM | POA: Diagnosis not present

## 2021-06-08 DIAGNOSIS — F1010 Alcohol abuse, uncomplicated: Secondary | ICD-10-CM | POA: Diagnosis present

## 2021-06-08 DIAGNOSIS — T424X2A Poisoning by benzodiazepines, intentional self-harm, initial encounter: Secondary | ICD-10-CM | POA: Diagnosis not present

## 2021-06-08 DIAGNOSIS — Z818 Family history of other mental and behavioral disorders: Secondary | ICD-10-CM

## 2021-06-08 DIAGNOSIS — F329 Major depressive disorder, single episode, unspecified: Secondary | ICD-10-CM | POA: Diagnosis present

## 2021-06-08 DIAGNOSIS — Z634 Disappearance and death of family member: Secondary | ICD-10-CM | POA: Diagnosis not present

## 2021-06-08 DIAGNOSIS — G43909 Migraine, unspecified, not intractable, without status migrainosus: Secondary | ICD-10-CM | POA: Diagnosis not present

## 2021-06-08 LAB — CBC WITH DIFFERENTIAL/PLATELET
Abs Immature Granulocytes: 0.02 10*3/uL (ref 0.00–0.07)
Basophils Absolute: 0 10*3/uL (ref 0.0–0.1)
Basophils Relative: 1 %
Eosinophils Absolute: 0.1 10*3/uL (ref 0.0–0.5)
Eosinophils Relative: 2 %
HCT: 40.8 % (ref 36.0–46.0)
Hemoglobin: 13.8 g/dL (ref 12.0–15.0)
Immature Granulocytes: 0 %
Lymphocytes Relative: 32 %
Lymphs Abs: 2.3 10*3/uL (ref 0.7–4.0)
MCH: 29.7 pg (ref 26.0–34.0)
MCHC: 33.8 g/dL (ref 30.0–36.0)
MCV: 87.9 fL (ref 80.0–100.0)
Monocytes Absolute: 0.5 10*3/uL (ref 0.1–1.0)
Monocytes Relative: 7 %
Neutro Abs: 4.1 10*3/uL (ref 1.7–7.7)
Neutrophils Relative %: 58 %
Platelets: 252 10*3/uL (ref 150–400)
RBC: 4.64 MIL/uL (ref 3.87–5.11)
RDW: 12.4 % (ref 11.5–15.5)
WBC: 7 10*3/uL (ref 4.0–10.5)
nRBC: 0 % (ref 0.0–0.2)

## 2021-06-08 LAB — COMPREHENSIVE METABOLIC PANEL
ALT: 20 U/L (ref 0–44)
AST: 23 U/L (ref 15–41)
Albumin: 4.5 g/dL (ref 3.5–5.0)
Alkaline Phosphatase: 59 U/L (ref 38–126)
Anion gap: 9 (ref 5–15)
BUN: 12 mg/dL (ref 6–20)
CO2: 27 mmol/L (ref 22–32)
Calcium: 9.7 mg/dL (ref 8.9–10.3)
Chloride: 100 mmol/L (ref 98–111)
Creatinine, Ser: 0.71 mg/dL (ref 0.44–1.00)
GFR, Estimated: >60 mL/min (ref >60)
Glucose, Bld: 146 mg/dL — ABNORMAL HIGH (ref 70–99)
Potassium: 4 mmol/L (ref 3.5–5.1)
Sodium: 136 mmol/L (ref 135–145)
Total Bilirubin: 0.9 mg/dL (ref 0.3–1.2)
Total Protein: 6.8 g/dL (ref 6.5–8.1)

## 2021-06-08 LAB — POCT URINE DRUG SCREEN - MANUAL ENTRY (I-SCREEN)
POC Amphetamine UR: NOT DETECTED
POC Buprenorphine (BUP): NOT DETECTED
POC Cocaine UR: NOT DETECTED
POC Marijuana UR: NOT DETECTED
POC Methadone UR: NOT DETECTED
POC Methamphetamine UR: NOT DETECTED
POC Morphine: NOT DETECTED
POC Oxazepam (BZO): NOT DETECTED
POC Oxycodone UR: NOT DETECTED
POC Secobarbital (BAR): NOT DETECTED

## 2021-06-08 LAB — HEMOGLOBIN A1C
Hgb A1c MFr Bld: 5 % (ref 4.8–5.6)
Mean Plasma Glucose: 96.8 mg/dL

## 2021-06-08 LAB — LIPID PANEL
Cholesterol: 185 mg/dL (ref 0–200)
HDL: 71 mg/dL (ref >40)
LDL Cholesterol: 84 mg/dL (ref 0–99)
Total CHOL/HDL Ratio: 2.6 RATIO
Triglycerides: 148 mg/dL (ref <150)
VLDL: 30 mg/dL (ref 0–40)

## 2021-06-08 LAB — T4, FREE: Free T4: 0.7 ng/dL (ref 0.61–1.12)

## 2021-06-08 LAB — RESP PANEL BY RT-PCR (FLU A&B, COVID) ARPGX2
Influenza A by PCR: NEGATIVE
Influenza B by PCR: NEGATIVE
SARS Coronavirus 2 by RT PCR: NEGATIVE

## 2021-06-08 LAB — ETHANOL: Alcohol, Ethyl (B): <10 mg/dL (ref <10)

## 2021-06-08 LAB — POCT PREGNANCY, URINE: Preg Test, Ur: NEGATIVE

## 2021-06-08 LAB — TSH: TSH: 6.437 u[IU]/mL — ABNORMAL HIGH (ref 0.350–4.500)

## 2021-06-08 MED ORDER — DOXEPIN HCL 25 MG PO CAPS
25.0000 mg | ORAL_CAPSULE | Freq: Every day | ORAL | Status: DC
  Administered 2021-06-08: 25 mg via ORAL
  Filled 2021-06-08 (×4): qty 1

## 2021-06-08 MED ORDER — ACETAMINOPHEN 325 MG PO TABS
650.0000 mg | ORAL_TABLET | Freq: Four times a day (QID) | ORAL | Status: DC | PRN
  Administered 2021-06-08 (×2): 650 mg via ORAL
  Filled 2021-06-08 (×2): qty 2

## 2021-06-08 MED ORDER — HYDROXYZINE HCL 25 MG PO TABS: 25.0000 mg | ORAL_TABLET | Freq: Three times a day (TID) | ORAL | Status: DC | PRN

## 2021-06-08 MED ORDER — ALUM & MAG HYDROXIDE-SIMETH 200-200-20 MG/5ML PO SUSP: 30.0000 mL | ORAL | Status: DC | PRN

## 2021-06-08 MED ORDER — MAGNESIUM HYDROXIDE 400 MG/5ML PO SUSP: 30.0000 mL | Freq: Every day | ORAL | Status: DC | PRN

## 2021-06-08 MED ORDER — TRAZODONE HCL 50 MG PO TABS: 50.0000 mg | ORAL_TABLET | Freq: Every evening | ORAL | Status: DC | PRN

## 2021-06-08 MED ORDER — ACETAMINOPHEN 325 MG PO TABS
650.0000 mg | ORAL_TABLET | Freq: Four times a day (QID) | ORAL | Status: DC | PRN
Start: 2021-06-08 — End: 2021-06-10
  Administered 2021-06-08: 650 mg via ORAL
  Filled 2021-06-08: qty 2

## 2021-06-08 MED ORDER — DULOXETINE HCL 60 MG PO CPEP
60.0000 mg | ORAL_CAPSULE | Freq: Every day | ORAL | Status: DC
  Administered 2021-06-09 – 2021-06-10 (×2): 60 mg via ORAL
  Filled 2021-06-08 (×4): qty 1

## 2021-06-08 MED ORDER — DULOXETINE HCL 60 MG PO CPEP
60.0000 mg | ORAL_CAPSULE | Freq: Every day | ORAL | Status: DC
  Administered 2021-06-08: 60 mg via ORAL
  Filled 2021-06-08: qty 1

## 2021-06-08 NOTE — [facility] Group Notes (Signed)
[facility] Group Notes:  (Nursing/MHT/Case Management/Adjunct)  Date:  06/08/2021  Time:  8:52 PM  Type of Therapy:   wrap up group  Participation Level:  Active  Participation Quality:  Attentive  Affect:  Appropriate  Cognitive:  Appropriate  Insight:  Improving  Engagement in Group:  Developing/Improving  Modes of Intervention:  Discussion  Summary of Progress/Problems: PT was calm bu addiment about knowing when she would be going home. I told the patient that it would depend on her treatment process goes.   Lorita Officer 06/08/2021, 8:52 PM

## 2021-06-08 NOTE — ED Notes (Signed)
Pt alert, oriented and ambulatory.  She states she feels depressed at the moment but denies any SI, HI, and AVH. She reports she is concerned about her position at work.  Helped pt obtain work number to place call to employer.  Breathing is even and un-labored.  Will continue to monitor for safety.
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
## 2021-06-08 NOTE — Tx Team (Signed)
Initial Treatment Plan 06/08/2021 2:56 PM Wren Gammage FIE:[id number]    PATIENT STRESSORS: Financial difficulties   Medication change or noncompliance   Occupational concerns   Substance abuse   Traumatic event     PATIENT STRENGTHS: Ability for insight  Printmaker for treatment/growth  Physical Health  Supportive family/friends  Work skills    PATIENT IDENTIFIED PROBLEMS: anxiety  depression  si  Medication change               DISCHARGE CRITERIA:  Ability to meet basic life and health needs Improved stabilization in mood, thinking, and/or behavior Motivation to continue treatment in a less acute level of care Need for constant or close observation no longer present  PRELIMINARY DISCHARGE PLAN: Attend aftercare/continuing care group Outpatient therapy Return to previous living arrangement Return to previous work or school arrangements  PATIENT/FAMILY INVOLVEMENT: This treatment plan has been presented to and reviewed with the patient, Naliya Kivett.  The patient and family have been given the opportunity to ask questions and make suggestions.  Raylene Miyamoto, RN 06/08/2021, 2:56 PM

## 2021-06-08 NOTE — ED Notes (Signed)
Pt sleeping@this time. Breathign even and unlabored. Will continue to monitor for safety 

## 2021-06-08 NOTE — ED Notes (Signed)
Safe transport has been arranged for pt to go from [facility] to [facility]. Nurse to nurse report has been called and the pt is aware/ agreeable to go to [facility].  Will continue to monitor for safety.

## 2021-06-08 NOTE — ED Provider Notes (Signed)
Behavioral Health Progress Note  Date and Time: 06/08/2021 8:45 AM Name: Laura Lucas MRN:  [id number]  Subjective: Patient reports "I took more medication than needed last night, I have done this before, the last time was in August." She reports she typically contemplated suicide when she "just wants everything to stop."  Laura Lucas reports she would prefer not to be admitted for inpatient psychiatric treatment as she has recently began a new job. Importance of inpatient psychiatric treatment, patient verbalizes understanding.  She agrees with plan to be admitted to inpatient.  She has been diagnosed with depression.  She is currently prescribed Cymbalta, she reports on yesterday she was suicidal with a plan to overdose on Klonopin that she had remaining from a previous prescription.  She is followed by outpatient psychiatry through [facility], [facility].  She is followed by outpatient therapy through, [facility]. Counselor [employer]. She meets with counselor every two weeks.   Patient offered support and encouragement.  Diagnosis:  Final diagnoses:  MDD (major depressive disorder), recurrent severe, without psychosis (HCC)  Suicidal ideation    Total Time spent with patient: 20 minutes  Past Psychiatric History:  Past Medical History:  Past Medical History:  Diagnosis Date   Acne    Anemia    Anxiety    Common migraine    Depression    Major depression    No past surgical history on file. Family History:  Family History  Problem Relation Age of Onset   Healthy Mother    Healthy Father    Thyroid disease Sister    Family Psychiatric  History: MDD Social History:  Social History   Substance and Sexual Activity  Alcohol Use Yes   Alcohol/week: 1.0 standard drink   Types: 1 Standard drinks or equivalent per week     Social History   Substance and Sexual Activity  Drug Use No    Social History   Socioeconomic History   Marital status:  Single    Spouse name: Not on file   Number of children: Not on file   Years of education: Not on file   Highest education level: Not on file  Occupational History   Not on file  Tobacco Use   Smoking status: Never   Smokeless tobacco: Never  Substance and Sexual Activity   Alcohol use: Yes    Alcohol/week: 1.0 standard drink    Types: 1 Standard drinks or equivalent per week   Drug use: No   Sexual activity: Yes    Birth control/protection: I.U.D.  Other Topics Concern   Not on file  Social History Narrative   Not on file   Social Determinants of Health   Financial Resource Strain: Not on file  Food Insecurity: Not on file  Transportation Needs: Not on file  Physical Activity: Not on file  Stress: Not on file  Social Connections: Not on file   SDOH:  SDOH Screenings   Alcohol Screen: Not on file  Depression (PHQ2-9): Medium Risk   PHQ-2 Score: 22  Financial Resource Strain: Not on file  Food Insecurity: Not on file  Housing: Not on file  Physical Activity: Not on file  Social Connections: Not on file  Stress: Not on file  Tobacco Use: Not on file  Transportation Needs: Not on file   Additional Social History:    Pain Medications: See MAR Prescriptions: See MAR Over the Counter: See MAR History of alcohol / drug use?: No history of alcohol /  drug abuse Longest period of sobriety (when/how long): N/A Negative Consequences of Use:  (N/A) Withdrawal Symptoms:  (N/A)                    Sleep: Fair  Appetite:  Fair  Current Medications:  Current Facility-Administered Medications  Medication Dose Route Frequency Provider Last Rate Last Admin   acetaminophen (TYLENOL) tablet 650 mg  650 mg Oral Q6H PRN Jaclyn Shaggy, PA-C   650 mg at 06/08/21 0203   alum & mag hydroxide-simeth (MAALOX/MYLANTA) 200-200-20 MG/5ML suspension 30 mL  30 mL Oral Q4H PRN Melbourne Abts W, PA-C       DULoxetine (CYMBALTA) DR capsule 60 mg  60 mg Oral Daily Ladona Ridgel, Cody W,  PA-C       hydrOXYzine (ATARAX/VISTARIL) tablet 25 mg  25 mg Oral TID PRN Melbourne Abts W, PA-C       magnesium hydroxide (MILK OF MAGNESIA) suspension 30 mL  30 mL Oral Daily PRN Melbourne Abts W, PA-C       traZODone (DESYREL) tablet 50 mg  50 mg Oral QHS PRN Jaclyn Shaggy, PA-C       Current Outpatient Medications  Medication Sig Dispense Refill   clonazePAM (KLONOPIN) 1 MG disintegrating tablet Take 1 mg by mouth 2 (two) times daily.     doxepin (SINEQUAN) 25 MG capsule Take by mouth. (Patient not taking: Reported on 06/08/2021)     DULoxetine (CYMBALTA) 60 MG capsule Take 60 mg by mouth daily.     gabapentin (NEURONTIN) 100 MG capsule Take by mouth. (Patient not taking: Reported on 06/08/2021)     hydrOXYzine (ATARAX/VISTARIL) 25 MG tablet Take 25 mg by mouth in the morning, at noon, in the evening, and at bedtime.      Labs  Lab Results:  Admission on 06/07/2021  Component Date Value Ref Range Status   SARS Coronavirus 2 by RT PCR 06/08/2021 NEGATIVE  NEGATIVE Final   Comment: (NOTE) SARS-CoV-2 target nucleic acids are NOT DETECTED.  The SARS-CoV-2 RNA is generally detectable in upper respiratory specimens during the acute phase of infection. The lowest concentration of SARS-CoV-2 viral copies this assay can detect is 138 copies/mL. A negative result does not preclude SARS-Cov-2 infection and should not be used as the sole basis for treatment or other patient management decisions. A negative result may occur with  improper specimen collection/handling, submission of specimen other than nasopharyngeal swab, presence of viral mutation(s) within the areas targeted by this assay, and inadequate number of viral copies(<138 copies/mL). A negative result must be combined with clinical observations, patient history, and epidemiological information. The expected result is Negative.  Fact Sheet for Patients:  BloggerCourse.com  Fact Sheet for Healthcare  Providers:  SeriousBroker.it  This test is no                          t yet approved or cleared by the [place] FDA and  has been authorized for detection and/or diagnosis of SARS-CoV-2 by FDA under an Emergency Use Authorization (EUA). This EUA will remain  in effect (meaning this test can be used) for the duration of the COVID-19 declaration under Section 564(b)(1) of the Act, 21 U.S.C.section 360bbb-3(b)(1), unless the authorization is terminated  or revoked sooner.       Influenza A by PCR 06/08/2021 NEGATIVE  NEGATIVE Final   Influenza B by PCR 06/08/2021 NEGATIVE  NEGATIVE Final   Comment: (NOTE) The Xpert  Xpress SARS-CoV-2/FLU/RSV plus assay is intended as an aid in the diagnosis of influenza from Nasopharyngeal swab specimens and should not be used as a sole basis for treatment. Nasal washings and aspirates are unacceptable for Xpert Xpress SARS-CoV-2/FLU/RSV testing.  Fact Sheet for Patients: BloggerCourse.com  Fact Sheet for Healthcare Providers: SeriousBroker.it  This test is not yet approved or cleared by the [place] FDA and has been authorized for detection and/or diagnosis of SARS-CoV-2 by FDA under an Emergency Use Authorization (EUA). This EUA will remain in effect (meaning this test can be used) for the duration of the COVID-19 declaration under Section 564(b)(1) of the Act, 21 U.S.C. section 360bbb-3(b)(1), unless the authorization is terminated or revoked.  Performed at [facility], [address] [place], [place] [address]    WBC 06/08/2021 7.0  4.0 - 10.5 K/uL Final   RBC 06/08/2021 4.64  3.87 - 5.11 MIL/uL Final   Hemoglobin 06/08/2021 13.8  12.0 - 15.0 g/dL Final   HCT 60/45/4098 40.8  36.0 - 46.0 % Final   MCV 06/08/2021 87.9  80.0 - 100.0 fL Final   MCH 06/08/2021 29.7  26.0 - 34.0 pg Final   MCHC 06/08/2021 33.8  30.0 - 36.0 g/dL Final   RDW 11/91/4782  12.4  11.5 - 15.5 % Final   Platelets 06/08/2021 252  150 - 400 K/uL Final   nRBC 06/08/2021 0.0  0.0 - 0.2 % Final   Neutrophils Relative % 06/08/2021 58  % Final   Neutro Abs 06/08/2021 4.1  1.7 - 7.7 K/uL Final   Lymphocytes Relative 06/08/2021 32  % Final   Lymphs Abs 06/08/2021 2.3  0.7 - 4.0 K/uL Final   Monocytes Relative 06/08/2021 7  % Final   Monocytes Absolute 06/08/2021 0.5  0.1 - 1.0 K/uL Final   Eosinophils Relative 06/08/2021 2  % Final   Eosinophils Absolute 06/08/2021 0.1  0.0 - 0.5 K/uL Final   Basophils Relative 06/08/2021 1  % Final   Basophils Absolute 06/08/2021 0.0  0.0 - 0.1 K/uL Final   Immature Granulocytes 06/08/2021 0  % Final   Abs Immature Granulocytes 06/08/2021 0.02  0.00 - 0.07 K/uL Final   Performed at [facility], [address] [place], [place] [address]   Sodium 06/08/2021 136  135 - 145 mmol/L Final   Potassium 06/08/2021 4.0  3.5 - 5.1 mmol/L Final   Chloride 06/08/2021 100  98 - 111 mmol/L Final   CO2 06/08/2021 27  22 - 32 mmol/L Final   Glucose, Bld 06/08/2021 146 (A) 70 - 99 mg/dL Final   Glucose reference range applies only to samples taken after fasting for at least 8 hours.   BUN 06/08/2021 12  6 - 20 mg/dL Final   Creatinine, Ser 06/08/2021 0.71  0.44 - 1.00 mg/dL Final   Calcium 30/86/5784 9.7  8.9 - 10.3 mg/dL Final   Total Protein 69/62/9528 6.8  6.5 - 8.1 g/dL Final   Albumin 41/32/4401 4.5  3.5 - 5.0 g/dL Final   AST 02/72/5366 23  15 - 41 U/L Final   ALT 06/08/2021 20  0 - 44 U/L Final   Alkaline Phosphatase 06/08/2021 59  38 - 126 U/L Final   Total Bilirubin 06/08/2021 0.9  0.3 - 1.2 mg/dL Final   GFR, Estimated 06/08/2021 >60  >60 mL/min Final   Comment: (NOTE) Calculated using the CKD-EPI Creatinine Equation (2021)    Anion gap 06/08/2021 9  5 - 15 Final  Performed at [facility], [address] [place], [place] [address]   Hgb A1c MFr Bld 06/08/2021 5.0  4.8 - 5.6 % Final   Comment: (NOTE) Pre diabetes:           5.7%-6.4%  Diabetes:              >6.4%  Glycemic control for   <7.0% adults with diabetes    Mean Plasma Glucose 06/08/2021 96.8  mg/dL Final   Performed at [facility], [address] [place], [place] [address]   TSH 06/08/2021 6.437 (A) 0.350 - 4.500 uIU/mL Final   Comment: Performed by a 3rd Generation assay with a functional sensitivity of <=0.01 uIU/mL. Performed at [facility], [address], [place], [place] [address]    Alcohol, Ethyl (B) 06/08/2021 <10  <10 mg/dL Final   Comment: (NOTE) Lowest detectable limit for serum alcohol is 10 mg/dL.  For medical purposes only. Performed at [facility], [address], [place], [place] [address]    POC Amphetamine UR 06/08/2021 None Detected  NONE DETECTED (Cut Off Level 1000 ng/mL) Preliminary   POC Secobarbital (BAR) 06/08/2021 None Detected  NONE DETECTED (Cut Off Level 300 ng/mL) Preliminary   POC Buprenorphine (BUP) 06/08/2021 None Detected  NONE DETECTED (Cut Off Level 10 ng/mL) Preliminary   POC Oxazepam (BZO) 06/08/2021 None Detected  NONE DETECTED (Cut Off Level 300 ng/mL) Preliminary   POC Cocaine UR 06/08/2021 None Detected  NONE DETECTED (Cut Off Level 300 ng/mL) Preliminary   POC Methamphetamine UR 06/08/2021 None Detected  NONE DETECTED (Cut Off Level 1000 ng/mL) Preliminary   POC Morphine 06/08/2021 None Detected  NONE DETECTED (Cut Off Level 300 ng/mL) Preliminary   POC Oxycodone UR 06/08/2021 None Detected  NONE DETECTED (Cut Off Level 100 ng/mL) Preliminary   POC Methadone UR 06/08/2021 None Detected  NONE DETECTED (Cut Off Level 300 ng/mL) Preliminary   POC Marijuana UR 06/08/2021 None Detected  NONE DETECTED (Cut Off Level 50 ng/mL) Preliminary   Preg Test, Ur 06/08/2021 NEGATIVE  NEGATIVE Final   Comment:        THE SENSITIVITY OF THIS METHODOLOGY IS >24 mIU/mL    Cholesterol 06/08/2021 185  0 - 200 mg/dL Final   Triglycerides 52/77/8242 148  <150 mg/dL Final   HDL 35/36/1443 71   >40 mg/dL Final   Total CHOL/HDL Ratio 06/08/2021 2.6  RATIO Final   VLDL 06/08/2021 30  0 - 40 mg/dL Final   LDL Cholesterol 06/08/2021 84  0 - 99 mg/dL Final   Comment:        Total Cholesterol/HDL:CHD Risk Coronary Heart Disease Risk Table                     Men   Women  1/2 Average Risk   3.4   3.3  Average Risk       5.0   4.4  2 X Average Risk   9.6   7.1  3 X Average Risk  23.4   11.0        Use the calculated Patient Ratio above and the CHD Risk Table to determine the patient's CHD Risk.        ATP III CLASSIFICATION (LDL):  <100     mg/dL   Optimal  154-008  mg/dL   Near or Above  Optimal  130-159  mg/dL   Borderline  409-811  mg/dL   High  >914     mg/dL   Very High Performed at [facility], [address], [place], [place] [address]    Free T4 06/08/2021 0.70  0.61 - 1.12 ng/dL Final   Comment: (NOTE) Biotin ingestion may interfere with free T4 tests. If the results are inconsistent with the TSH level, previous test results, or the clinical presentation, then consider biotin interference. If needed, order repeat testing after stopping biotin. Performed at [facility], [address], [place], [place] [address]     Blood Alcohol level:  Lab Results  Component Value Date   ETH <10 06/08/2021    Metabolic Disorder Labs: Lab Results  Component Value Date   HGBA1C 5.0 06/08/2021   MPG 96.8 06/08/2021   No results found for: PROLACTIN Lab Results  Component Value Date   CHOL 185 06/08/2021   TRIG 148 06/08/2021   HDL 71 06/08/2021   CHOLHDL 2.6 06/08/2021   VLDL 30 06/08/2021   LDLCALC 84 06/08/2021    Therapeutic Lab Levels: No results found for: LITHIUM No results found for: VALPROATE No components found for:  CBMZ  Physical Findings   PHQ2-9    Flowsheet Row ED from 06[facility]07/2021 in [facility]  PHQ-2 Total Score 5  PHQ-9 Total Score 22      Flowsheet Row ED from 06/07/2021  in [facility]  C-SSRS RISK CATEGORY High Risk        Musculoskeletal  Strength & Muscle Tone: within normal limits Gait & Station: normal Patient leans: N/A  Psychiatric Specialty Exam  Presentation  General Appearance: Appropriate for Environment; Casual  Eye Contact:Good  Speech:Blocked; Normal Rate  Speech Volume:Normal  Handedness:Right   Mood and Affect  Mood:Depressed  Affect:Tearful; Depressed   Thought Process  Thought Processes:Coherent; Goal Directed; Linear  Descriptions of Associations:Intact  Orientation:Full (Time, Place and Person)  Thought Content:Logical  Diagnosis of Schizophrenia or Schizoaffective disorder in past: No    Hallucinations:Hallucinations: None  Ideas of Reference:None  Suicidal Thoughts:Suicidal Thoughts: No  Homicidal Thoughts:Homicidal Thoughts: No   Sensorium  Memory:Immediate Good; Recent Good; Remote Good  Judgment:Fair  Insight:Fair   Executive Functions  Concentration:Good  Attention Span:Good  Recall:Good  Fund of Knowledge:Good  Language:Good   Psychomotor Activity  Psychomotor Activity:Psychomotor Activity: Normal   Assets  Assets:Communication Skills; Desire for Improvement; Financial Resources/Insurance; Housing; Intimacy; Leisure Time; Physical Health; Resilience; Social Support; Talents/Skills   Sleep  Sleep:Sleep: Fair   Nutritional Assessment (For OBS and FBC admissions only) Has the patient had a weight loss or gain of 10 pounds or more in the last 3 months?: Yes Has the patient had a decrease in food intake/or appetite?: Yes Does the patient have dental problems?: No Does the patient have eating habits or behaviors that may be indicators of an eating disorder including binging or inducing vomiting?: No Has the patient recently lost weight without trying?: 1 Has the patient been eating poorly because of a decreased appetite?: 1 Malnutrition Screening  Tool Score: 2    Physical Exam  Physical Exam Vitals and nursing note reviewed.  Constitutional:      Appearance: Normal appearance. She is well-developed and normal weight.  HENT:     Head: Normocephalic and atraumatic.     Nose: Nose normal.  Cardiovascular:     Rate and Rhythm: Normal rate.  Pulmonary:  Effort: Pulmonary effort is normal.  Musculoskeletal:        General: Normal range of motion.     Cervical back: Normal range of motion.  Skin:    General: Skin is warm and dry.  Neurological:     Mental Status: She is alert and oriented to person, place, and time.  Psychiatric:        Attention and Perception: Attention and perception normal.        Mood and Affect: Mood is depressed. Affect is tearful.        Speech: Speech normal.        Behavior: Behavior normal. Behavior is cooperative.        Thought Content: Thought content normal.        Cognition and Memory: Cognition and memory normal.        Judgment: Judgment normal.   Review of Systems  Constitutional: Negative.   HENT: Negative.    Eyes: Negative.   Respiratory: Negative.    Cardiovascular: Negative.   Gastrointestinal: Negative.   Genitourinary: Negative.   Musculoskeletal: Negative.   Skin: Negative.   Neurological: Negative.   Endo/Heme/Allergies: Negative.   Psychiatric/Behavioral:  Positive for depression.   Blood pressure 126/73, pulse 91, temperature 98.4 F (36.9 C), temperature source Oral, resp. rate 16, SpO2 99 %. There is no height or weight on file to calculate BMI.  Treatment Plan Summary: Patient reviewed with Dr Nelly Rout. Daily contact with patient to assess and evaluate symptoms and progress in treatment Inpatient psychiatric treatment recommended.    Lenard Lance, FNP 06/08/2021 8:45 AM

## 2021-06-08 NOTE — Progress Notes (Signed)
Patient information has been sent to [facility] via secure chat to review for potential admission. Patient meets inpatient criteria per Melbourne Abts, PA-C .   Situation ongoing, CSW will continue to monitor progress.    Signed:  Damita Dunnings, MSW, LCSW-A  06/08/2021 9:42 AM

## 2021-06-08 NOTE — BH Assessment (Signed)
[facility] AC Kim, RN, returned clinician's inquiry at [employer] re: a bed for pt at [facility]; there is no appropriate bed for pt at this time.

## 2021-06-08 NOTE — [facility] Group Notes (Signed)
Patient did not attend the Psycho-Ed group. 

## 2021-06-08 NOTE — Progress Notes (Signed)
   06/08/21 2200  Psych Admission Type (Psych Patients Only)  Admission Status Voluntary  Psychosocial Assessment  Patient Complaints Anxiety  Eye Contact Fair  Facial Expression Anxious;Pensive;Sullen;Sad;Worried  Affect Anxious;Depressed;Sad;Sullen  Surveyor, minerals Activity Slow  Appearance/Hygiene Unremarkable  Behavior Characteristics Cooperative;Appropriate to situation  Mood Anxious  Thought Process  Coherency Concrete thinking  Content Blaming others  Delusions None reported or observed  Perception WDL  Hallucination None reported or observed  Judgment Poor  Confusion None  Danger to Self  Current suicidal ideation? Denies  Self-Injurious Behavior No self-injurious ideation or behavior indicators observed or expressed   Danger to Others  Danger to Others None reported or observed

## 2021-06-08 NOTE — Progress Notes (Signed)
Pt accepted to [facility] 304-1    Patient meets inpatient criteria per Melbourne Abts, PA-C  The attending provider will be Dr. Viviano Simas  Call report to [phone]    Isaiah Serge, LPN @ [facility] notified.     Pt scheduled  to arrive at [facility] today by 1300. Voluntary consent must be faxed and received prior to transporting the Pt to the facility.    Damita Dunnings, MSW, LCSW-A  11:17 AM 06/08/2021
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
## 2021-06-08 NOTE — Discharge Instructions (Signed)
Take all medications as prescribed. Keep all follow-up appointments as scheduled.  Do not consume alcohol or use illegal drugs while on prescription medications. Report any adverse effects from your medications to your primary care provider promptly.  In the event of recurrent symptoms or worsening symptoms, call 911, a crisis hotline, or go to the nearest emergency department for evaluation.   
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
## 2021-06-08 NOTE — ED Notes (Addendum)
Pt states she wants to be discharged home.  PA Melbourne Abts notified.Laura Lucas at bedside talking with pt at present.

## 2021-06-09 ENCOUNTER — Telehealth (HOSPITAL_COMMUNITY): Payer: Self-pay | Admitting: Physician Assistant

## 2021-06-09 DIAGNOSIS — F332 Major depressive disorder, recurrent severe without psychotic features: Secondary | ICD-10-CM | POA: Diagnosis present

## 2021-06-09 LAB — T3, FREE: T3, Free: 3.2 pg/mL (ref 2.0–4.4)

## 2021-06-09 MED ORDER — TRAZODONE HCL 50 MG PO TABS
50.0000 mg | ORAL_TABLET | Freq: Every evening | ORAL | Status: DC | PRN
  Administered 2021-06-09: 50 mg via ORAL
  Filled 2021-06-09: qty 1

## 2021-06-09 NOTE — Progress Notes (Signed)
   06/09/21 0824  Psych Admission Type (Psych Patients Only)  Admission Status Voluntary  Psychosocial Assessment  Patient Complaints Anxiety  Eye Contact Fair  Facial Expression Animated;Anxious  Affect Anxious;Appropriate to circumstance  Speech Logical/coherent  Interaction Assertive  Motor Activity Slow  Appearance/Hygiene Unremarkable  Behavior Characteristics Cooperative;Appropriate to situation;Anxious  Mood Anxious;Pleasant  Thought Process  Coherency Concrete thinking  Content WDL  Delusions None reported or observed  Perception WDL  Judgment Poor  Confusion None  Danger to Self  Current suicidal ideation? Denies  Self-Injurious Behavior No self-injurious ideation or behavior indicators observed or expressed   Danger to Others  Danger to Others None reported or observed  Dar Note: Patient presents with a calm affect and pleasant mood.  Denies suicidal thoughts, auditory and visual hallucinations.  Medications given as prescribed.  Routine safety checks maintained.  Attended group and participated.  Patient is safe on and off the unit.

## 2021-06-09 NOTE — [facility] Suicide Risk Assessment (Signed)
[facility] INPATIENT:  Family/Significant Other Suicide Prevention Education  Suicide Prevention Education:  Education Completed; Lillah Standre [phone] (Sister) has been identified by the patient as the family member/significant other with whom the patient will be residing, and identified as the person(s) who will aid the patient in the event of a mental health crisis (suicidal ideations/suicide attempt).  With written consent from the patient, the family member/significant other has been provided the following suicide prevention education, prior to the and/or following the discharge of the patient.  The suicide prevention education provided includes the following: Suicide risk factors Suicide prevention and interventions National Suicide Hotline telephone number [facility] assessment telephone number [facility] Emergency Assistance 911 [facility] and/or Residential Mobile Crisis Unit telephone number  Request made of family/significant other to: Remove weapons (e.g., guns, rifles, knives), all items previously/currently identified as safety concern.   Remove drugs/medications (over-the-counter, prescriptions, illicit drugs), all items previously/currently identified as a safety concern.  The family member/significant other verbalizes understanding of the suicide prevention education information provided.  The family member/significant other agrees to remove the items of safety concern listed above.  CSW spoke with Ms. Shipton who states that her sister has been experiencing depression since childhood.  She states that her sister has not had any previous suicide attempts but has experienced some traumatic events that she has not sought counseling for.  She states that recently their grandmother passed and their family sold their childhood home.  Ms. Privitera states that their mother has also not been emotionally supportive during their childhood.  She states that she has  removed her sisters Doxepin and Klonopin. She also states that there are no firearms or weapons in the home.  CSW completed SPE with Ms. Willmott.    [employer] Kung Kenidy Crossland 06/09/2021, 1:41 PM

## 2021-06-09 NOTE — H&P (Addendum)
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 in multi-disciplinary team meeting Observation Level : q15 minute checks Vital signs: q12 hours Precautions: suicide, elopement, and assault   4. Discharge Planning: Social work and case management to assist with discharge planning and identification of hospital follow-up needs prior to discharge Estimated LOS: 3-5 days Discharge Concerns: Need to establish a safety plan; Medication compliance and effectiveness Discharge Goals:  Return home with outpatient referrals for mental health follow-up including medication management/psychotherapy  Observation Level/Precautions:  15 minute checks  Laboratory:  CBC Chemistry Profile  Psychotherapy:    Medications:    Consultations:    Discharge Concerns:    Estimated LOS:  Other:     Physician Treatment Plan for Primary Diagnosis: MDD (major depressive disorder), recurrent severe, without psychosis (HCC) Long Term Goal(s): Improvement in symptoms so as ready for discharge  Short Term Goals: Ability to identify changes in lifestyle to reduce recurrence of condition will improve, Ability to verbalize feelings will improve, Ability to disclose and discuss suicidal ideas, Ability to demonstrate self-control will improve, Ability to identify and develop effective coping behaviors will improve, Ability to maintain clinical measurements within normal limits will improve, Compliance with prescribed medications will improve, and Ability to identify triggers associated with substance abuse/mental health issues will improve  Physician Treatment Plan for Secondary Diagnosis: Principal Problem:   MDD (major depressive disorder), recurrent severe, without psychosis (HCC)  Long Term Goal(s): Improvement in symptoms so as ready for discharge  Short Term Goals: Ability to identify changes in lifestyle to reduce recurrence of condition will improve, Ability  to verbalize feelings will improve, Ability to disclose and discuss suicidal ideas, Ability to demonstrate self-control will improve, Ability to identify and develop effective coping behaviors will improve, Ability to maintain clinical measurements within normal limits will improve, Compliance with prescribed medications will improve, and Ability to identify triggers associated with substance abuse/mental health issues will improve  I certify that inpatient services furnished can reasonably be expected to improve the patient's condition.     Park Pope, MD 10/13/20221:40 PM

## 2021-06-09 NOTE — Progress Notes (Signed)
Progress note    06/09/21 0824  Psych Admission Type (Psych Patients Only)  Admission Status Voluntary  Psychosocial Assessment  Patient Complaints Anxiety  Eye Contact Fair  Facial Expression Animated;Anxious  Affect Anxious;Appropriate to circumstance  Speech Logical/coherent  Interaction Assertive  Motor Activity Slow  Appearance/Hygiene Unremarkable  Behavior Characteristics Cooperative;Appropriate to situation;Anxious  Mood Anxious;Pleasant  Thought Process  Coherency Concrete thinking  Content WDL  Delusions None reported or observed  Perception WDL  Judgment Poor  Confusion None  Danger to Self  Current suicidal ideation? Denies  Self-Injurious Behavior No self-injurious ideation or behavior indicators observed or expressed   Danger to Others  Danger to Others None reported or observed

## 2021-06-09 NOTE — Progress Notes (Signed)
   06/09/21 2128  Psych Admission Type (Psych Patients Only)  Admission Status Voluntary  Psychosocial Assessment  Patient Complaints None  Eye Contact Fair  Facial Expression Animated  Affect Appropriate to circumstance  Speech Logical/coherent  Interaction Assertive  Motor Activity Slow  Appearance/Hygiene Unremarkable  Behavior Characteristics Cooperative;Appropriate to situation  Mood Pleasant  Thought Process  Coherency WDL  Content WDL  Delusions None reported or observed  Perception WDL  Hallucination None reported or observed  Judgment Poor  Confusion None  Danger to Self  Current suicidal ideation? Denies  Self-Injurious Behavior No self-injurious ideation or behavior indicators observed or expressed   Agreement Not to Harm Self Yes  Description of Agreement Verbal Contract  Danger to Others  Danger to Others None reported or observed  Danger to Others Abnormal  Harmful Behavior to others No threats or harm toward other people

## 2021-06-09 NOTE — [facility] Group Notes (Signed)
[facility] Group Notes:  (Nursing/MHT/Case Management/Adjunct)  Date:  06/09/2021  Time:  8:45 PM  Type of Therapy:   wrap up  Participation Level:  Active  Participation Quality:  Appropriate and Attentive  Affect:  Appropriate  Cognitive:  Appropriate  Insight:  Appropriate, Good, and Improving  Engagement in Group:  Developing/Improving  Modes of Intervention:  Discussion  Summary of Progress/Problems: Pt had a great attitude.She spoke about being able to laugh today and hoping to smile more tomorrow. She is also wanting to work on keeping healthy habits.   Lorita Officer 06/09/2021, 8:45 PM

## 2021-06-09 NOTE — [facility] Group Notes (Signed)
[facility] Group Notes:  (Nursing/MHT/Case Management/Adjunct)  Date:  06/09/2021  Time:  11:51 AM  Type of Therapy:  Group Therapy  Participation Level:  Active  Participation Quality:  Appropriate  Affect:  Appropriate  Cognitive:  Appropriate  Insight:  Appropriate  Engagement in Group:  Engaged  Modes of Intervention:  Discussion and Education  Summary of Progress/Problems:Pt was engaged in orientation and goals group.  Jaquita Rector 06/09/2021, 11:51 AM

## 2021-06-09 NOTE — Plan of Care (Signed)
  Problem: Education: Goal: Ability to state activities that reduce stress will improve Outcome: Progressing   Problem: Coping: Goal: Ability to identify and develop effective coping behavior will improve Outcome: Progressing   Problem: Self-Concept: Goal: Ability to identify factors that promote anxiety will improve Outcome: Progressing   

## 2021-06-09 NOTE — BH Assessment (Signed)
Care Management - Follow Up [facility] Discharges   Patient has been placed in an inpatient psychiatric hospital [facility]) on 06-08-2021.
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
## 2021-06-09 NOTE — [facility] Suicide Risk Assessment (Signed)
[facility] INPATIENT:  Family/Significant Other Suicide Prevention Education  Suicide Prevention Education:  Education Completed; Louna Rothgeb [phone] (Father) has been identified by the patient as the family member/significant other with whom the patient will be residing, and identified as the person(s) who will aid the patient in the event of a mental health crisis (suicidal ideations/suicide attempt).  With written consent from the patient, the family member/significant other has been provided the following suicide prevention education, prior to the and/or following the discharge of the patient.  The suicide prevention education provided includes the following: Suicide risk factors Suicide prevention and interventions National Suicide Hotline telephone number [facility] assessment telephone number [facility] Emergency Assistance 911 [facility] and/or Residential Mobile Crisis Unit telephone number  Request made of family/significant other to: Remove weapons (e.g., guns, rifles, knives), all items previously/currently identified as safety concern.   Remove drugs/medications (over-the-counter, prescriptions, illicit drugs), all items previously/currently identified as a safety concern.  The family member/significant other verbalizes understanding of the suicide prevention education information provided.  The family member/significant other agrees to remove the items of safety concern listed above.  CSW spoke with Mr. Dunckel who states that he is concerned about his daughter discharge from the hospital and would like to speak with his daughter's doctor prior to discharge to make sure she is safe to return home and has the proper diagnosis.  He states that he is not sure that the 2 sister's should be living together and that he will be talking to both of them once his daughter is discharged because he feels that they are frustrated with each other.  Mr. Pfahler  states that he would like additional information about CBT and DBT agencies in the area so he can discuss these with his daughter.  He also states that he believes that his daughter has Borderline Personality Disorder and has attempted to discuss this with her in the past but she has not been receptive to it.  He states that her mother work in the mental health field and also believes this to be a possible diagnosis.  Mr. Bollier would also like the doctor to know that his daughter has difficulties swallowing pills and throughout her life has often had to break open the pills to sprinkle them on food or take her medication as a liquid.  CSW explained medications and follow-up to Mr. Laminack.  CSW also explained the current diagnosis and what type of referrals can be done if his daughter agrees to them.  CSW also agreed to email Mr. Pethtel a list of CBT and DBT therapists in the area that he can discuss with his daughter at a later date.  CSW will also let the doctor know that Mr. Gowens would like to have a telephone conversation with them.  CSW completed SPE with Mr. Koreena Joost 06/09/2021, 3:48 PM
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
## 2021-06-10 ENCOUNTER — Encounter (HOSPITAL_COMMUNITY): Payer: Self-pay

## 2021-06-10 DIAGNOSIS — F332 Major depressive disorder, recurrent severe without psychotic features: Secondary | ICD-10-CM | POA: Diagnosis not present

## 2021-06-10 MED ORDER — TRAZODONE HCL 50 MG PO TABS: 50.0000 mg | ORAL_TABLET | Freq: Every evening | ORAL | 0 refills | Status: AC | PRN

## 2021-06-10 NOTE — Progress Notes (Signed)
  [facility] Adult Case Management Discharge Plan :  Will you be returning to the same living situation after discharge:  Yes,  Home At discharge, do you have transportation home?: Yes,  Father  Do you have the ability to pay for your medications: Yes,  Insurance   Release of information consent forms completed and in the chart;  Patient's signature needed at discharge.  Patient to Follow up at:  Follow-up Information     Center, [facility]. Call.   Specialty: Behavioral Health Why: Please call to schedule an appointment for medication management services as we were unable to obtain prior to discharge.  (The provider may call you with an appointment.) Contact information: [address] [place] [address] [phone]         [facility] and [facility] [facility]. Go on 06/17/2021.   Why: You for therapy services on 06/17/21 at 12:00 pm. This appointment will be held in person, but may be switched to virtual. Contact information: [address] [place], [place] [address]  Phone: [phone]                Next level of care provider has access to [facility] Link:no  Safety Planning and Suicide Prevention discussed: Yes,  with patient, sister, and father      Has patient been referred to the Quitline?: N/A patient is not a smoker  Patient has been referred for addiction treatment: N/A  Aram Beecham, LCSWA 06/10/2021, 10:01 AM

## 2021-06-10 NOTE — [facility]HH Group Notes (Signed)
The focus of this group is to help patients establish daily goals to achieve during treatment and discuss how the patient can incorporate goal setting into their daily lives to aide in recovery.  Pt attended morning goals and orientation group. Pt had great participation.  

## 2021-06-10 NOTE — BH IP Treatment Plan (Signed)
Interdisciplinary Treatment and Diagnostic Plan Update  06/10/2021 Time of Session: 1:40pm Laura Lucas MRN: [id number]  Principal Diagnosis: MDD (major depressive disorder), recurrent severe, without psychosis (HCC)  Secondary Diagnoses: Principal Problem:   MDD (major depressive disorder), recurrent severe, without psychosis (HCC)   Current Medications:  Current Facility-Administered Medications  Medication Dose Route Frequency Provider Last Rate Last Admin   acetaminophen (TYLENOL) tablet 650 mg  650 mg Oral Q6H PRN Oneta Rack, NP   650 mg at 06/08/21 2119   alum & mag hydroxide-simeth (MAALOX/MYLANTA) 200-200-20 MG/5ML suspension 30 mL  30 mL Oral Q4H PRN Oneta Rack, NP       DULoxetine (CYMBALTA) DR capsule 60 mg  60 mg Oral Daily Oneta Rack, NP   60 mg at 06/10/21 0830   magnesium hydroxide (MILK OF MAGNESIA) suspension 30 mL  30 mL Oral Daily PRN Oneta Rack, NP       traZODone (DESYREL) tablet 50 mg  50 mg Oral QHS PRN,MR X 1 Park Pope, MD   50 mg at 06/09/21 2128   Current Outpatient Medications  Medication Sig Dispense Refill   Carboxymethylcellul-Glycerin (LUBRICATING EYE DROPS OP) Place 2 drops into both eyes daily as needed (For dry eyes.).     diphenhydrAMINE (SOMINEX) 25 MG tablet Take 25 mg by mouth at bedtime as needed for itching, allergies or sleep.     DULoxetine (CYMBALTA) 60 MG capsule Take 60 mg by mouth daily.     hydrOXYzine (ATARAX/VISTARIL) 25 MG tablet Take 25 mg by mouth at bedtime as needed for anxiety.     OVER THE COUNTER MEDICATION Take 1 tablet by mouth daily. Iodine Tablet     OVER THE COUNTER MEDICATION Take 1 capsule by mouth daily. Chaste Tree Berry     OVER THE COUNTER MEDICATION Take 1 tablet by mouth daily. Adaptacin Tablet     PARAGARD INTRAUTERINE COPPER IU 1 Intra Uterine Device by Intrauterine route once.     PREBIOTIC PRODUCT PO Take 1 capsule by mouth daily.     pseudoephedrine (SUDAFED) 30 MG tablet Take 30 mg by  mouth daily.     traZODone (DESYREL) 50 MG tablet Take 1 tablet (50 mg total) by mouth at bedtime as needed and may repeat dose one time if needed for sleep. 60 tablet 0   tretinoin (RETIN-A) 0.1 % cream Apply 1 application topically at bedtime as needed (Apply pea-sized amount to dry face, wash off every morning.).     VITAMIN D PO Take 1 tablet by mouth daily.     PTA Medications: No medications prior to admission.    Patient Stressors: Financial difficulties   Medication change or noncompliance   Occupational concerns   Substance abuse   Traumatic event    Patient Strengths: Ability for insight  Printmaker for treatment/growth  Physical Health  Supportive family/friends  Work skills   Treatment Modalities: Medication Management, Group therapy, Case management,  1 to 1 session with clinician, Psychoeducation, Recreational therapy.   Physician Treatment Plan for Primary Diagnosis: MDD (major depressive disorder), recurrent severe, without psychosis (HCC) Long Term Goal(s): Improvement in symptoms so as ready for discharge   Short Term Goals: Ability to identify changes in lifestyle to reduce recurrence of condition will improve Ability to verbalize feelings will improve Ability to disclose and discuss suicidal ideas Ability to demonstrate self-control will improve Ability to identify and develop effective coping behaviors will improve Ability to maintain clinical measurements within normal  limits will improve Compliance with prescribed medications will improve Ability to identify triggers associated with substance abuse/mental health issues will improve  Medication Management: Evaluate patient's response, side effects, and tolerance of medication regimen.  Therapeutic Interventions: 1 to 1 sessions, Unit Group sessions and Medication administration.  Evaluation of Outcomes: Adequate for Discharge  Physician Treatment Plan for Secondary Diagnosis:  Principal Problem:   MDD (major depressive disorder), recurrent severe, without psychosis (HCC)  Long Term Goal(s): Improvement in symptoms so as ready for discharge   Short Term Goals: Ability to identify changes in lifestyle to reduce recurrence of condition will improve Ability to verbalize feelings will improve Ability to disclose and discuss suicidal ideas Ability to demonstrate self-control will improve Ability to identify and develop effective coping behaviors will improve Ability to maintain clinical measurements within normal limits will improve Compliance with prescribed medications will improve Ability to identify triggers associated with substance abuse/mental health issues will improve     Medication Management: Evaluate patient's response, side effects, and tolerance of medication regimen.  Therapeutic Interventions: 1 to 1 sessions, Unit Group sessions and Medication administration.  Evaluation of Outcomes: Adequate for Discharge   RN Treatment Plan for Primary Diagnosis: MDD (major depressive disorder), recurrent severe, without psychosis (HCC) Long Term Goal(s): Knowledge of disease and therapeutic regimen to maintain health will improve  Short Term Goals: Ability to remain free from injury will improve, Ability to verbalize frustration and anger appropriately will improve, Ability to demonstrate self-control, and Compliance with prescribed medications will improve  Medication Management: RN will administer medications as ordered by provider, will assess and evaluate patient's response and provide education to patient for prescribed medication. RN will report any adverse and/or side effects to prescribing provider.  Therapeutic Interventions: 1 on 1 counseling sessions, Psychoeducation, Medication administration, Evaluate responses to treatment, Monitor vital signs and CBGs as ordered, Perform/monitor CIWA, COWS, AIMS and Fall Risk screenings as ordered, Perform wound  care treatments as ordered.  Evaluation of Outcomes: Adequate for Discharge   LCSW Treatment Plan for Primary Diagnosis: MDD (major depressive disorder), recurrent severe, without psychosis (HCC) Long Term Goal(s): Safe transition to appropriate next level of care at discharge, Engage patient in therapeutic group addressing interpersonal concerns.  Short Term Goals: Engage patient in aftercare planning with referrals and resources, Increase social support, Increase ability to appropriately verbalize feelings, Increase emotional regulation, and Increase skills for wellness and recovery  Therapeutic Interventions: Assess for all discharge needs, 1 to 1 time with Social worker, Explore available resources and support systems, Assess for adequacy in community support network, Educate family and significant other(s) on suicide prevention, Complete Psychosocial Assessment, Interpersonal group therapy.  Evaluation of Outcomes: Adequate for Discharge   Progress in Treatment: Attending groups: Yes. Participating in groups: Yes. Taking medication as prescribed: Yes. Toleration medication: Yes. Family/Significant other contact made: Yes, individual(s) contacted:  father Patient understands diagnosis: No. Discussing patient identified problems/goals with staff: Yes. Medical problems stabilized or resolved: Yes. Denies suicidal/homicidal ideation: Yes. Issues/concerns per patient self-inventory: No.   New problem(s) identified: No, Describe:  none  New Short Term/Long Term Goal(s): medication stabilization, elimination of SI thoughts, development of comprehensive mental wellness plan.    Patient Goals:  Did not attend  Discharge Plan or Barriers: Pt is to return home at discharge to stay with sister.  Reason for Continuation of Hospitalization: Medication stabilization  Estimated Length of Stay: Adequate for Discharge   Scribe for Treatment Team: Otelia Santee,  LCSW 06/10/2021 2:16 PM
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
## 2021-06-10 NOTE — BHH Suicide Risk Assessment (Signed)
[facility] Discharge Suicide Risk Assessment   Principal Problem: MDD (major depressive disorder), recurrent severe, without psychosis (HCC) Discharge Diagnoses: Principal Problem:   MDD (major depressive disorder), recurrent severe, without psychosis (HCC)  Total Time Spent in Direct Patient Care:  I personally spent 35 minutes on the unit in direct patient care. The direct patient care time included face-to-face time with the patient, reviewing the patient's chart, communicating with other professionals, and coordinating care. Greater than 50% of this time was spent in counseling or coordinating care with the patient regarding goals of hospitalization, psycho-education, and discharge planning needs.  Subjective: Patient was seen on rounds with Automotive engineer. She denies SI, HI, AVH, paranoia, or delusions. She denies medication side-effects and was again reminded she has room to potentially titrate up on her Cymbalta as an outpatient after discharge. We discussed potentially starting DBT and continuing outpatient psychotherapy after discharge. She reports stable sleep and appetite and voices no physical complaints. She was reminded she needs to have her TSH and thyroid panel rechecked by a PCP in the next 4 weeks without fail. Time was given for questions. She can articulate a safety and discharge plan and requests discharge today.  Musculoskeletal: Strength & Muscle Tone: within normal limits Gait & Station: normal Patient leans: N/A Psychiatric Specialty Exam: Physical Exam Vitals reviewed.  HENT:     Head: Normocephalic.  Pulmonary:     Effort: Pulmonary effort is normal.  Neurological:     General: No focal deficit present.     Mental Status: She is alert.    Review of Systems  Respiratory:  Negative for shortness of breath.   Cardiovascular:  Negative for chest pain.  Gastrointestinal:  Negative for diarrhea, nausea and vomiting.   Blood pressure 110/68, pulse 84, temperature 98.4 F  (36.9 C), temperature source Oral, resp. rate 18, height 5\' 4"  (1.626 m), weight 70.3 kg, SpO2 99 %.Body mass index is 26.61 kg/m.  General Appearance: Well Groomed and casually dressed  Eye Contact:  Good  Speech:  Clear and Coherent and Normal Rate  Volume:  Normal  Mood:  described as "good" - appears mildly anxious  Affect:  moderate, stable, mildly anxious appearing  Thought Process:  Goal Directed and Linear  Orientation:  Full (Time, Place, and Person)  Thought Content:  Logical and denies AVH, paranoia or delusions  Suicidal Thoughts:  No  Homicidal Thoughts:  No  Memory:  Recent;   Good  Judgement:  Intact  Insight:  Present  Psychomotor Activity:  Normal  Concentration:  Concentration: Good and Attention Span: Good  Recall:  Good  Fund of Knowledge:  Good  Language:  Good  Akathisia:  Negative  Assets:  Communication Skills Desire for Improvement Housing Resilience Social Support Talents/Skills Transportation Vocational/Educational  ADL's:  Intact  Cognition:  WNL  Sleep:  Number of Hours: 6.75   Mental Status Per Nursing Assessment::   On Admission:  Suicidal ideation indicated by others - resolved  Demographic Factors:  Adolescent or young adult and Caucasian  Loss Factors: Loss of significant relationship and loss of family home  Historical Factors: Prior suicide attempts, Family history of mental illness or substance abuse, Impulsivity, and h/o previous trauma  Risk Reduction Factors:   Sense of responsibility to family, Employed, Living with another person, especially a relative, Positive social support, Positive therapeutic relationship, and Positive coping skills or problem solving skills  Continued Clinical Symptoms:  Depression:   Impulsivity Previous Psychiatric Diagnoses and Treatments  Cognitive Features  That Contribute To Risk:  None    Suicide Risk:  Mild:  There are no identifiable plans, no associated intent, mild dysphoria and  related symptoms, few other risk factors, and identifiable protective factors, including available and accessible social support.   Follow-up Information     [facility] Follow up.   Specialty: Behavioral Health Why: You have an appointment for medication management services on Contact information: [address] [place] [address] [phone]         [facility] [employer]. Go on 06/17/2021.   Why: You for therapy services on 06/17/21 at 12:00 pm. This appointment will be held in person, but may be switched to virtual. Contact information: [address] [place], [place] [address]  Phone: [phone]                Plan Of Care/Follow-up recommendations:  Activity:  as tolerated Diet:  heart healthy Other:  Patient advised to keep scheduled outpatient mental health and therapy follow up appointments without fail. She was encouraged to abstain from alcohol and illicit substances and to comply with prescribed medications after discharge. She was advised to see a primary care physician within the next 3-4 weeks for recheck of her mildly elevated TSH and monitoring of her thyroid labs without fail.  Comer Locket, MD, FAPA 06/10/2021, 6:54 AM

## 2021-06-10 NOTE — Progress Notes (Signed)
Pt discharged to lobby. Pt was stable and appreciative at that time. All papers and prescriptions were given and valuables returned. Verbal understanding expressed. Denies SI/HI and A/VH. Pt given opportunity to express concerns and ask questions.  

## 2021-06-12 NOTE — Plan of Care (Signed)
Called patient to discuss avoiding using hydroxyzine in combination with benadryl. Patient verbalized understanding and stated that she had already disposed of the hydroxyzine as she was not taking it.  Laura Pope, MD PGY1 Psychiatry Resident

## 2022-03-03 ENCOUNTER — Ambulatory Visit (HOSPITAL_COMMUNITY)
Admission: AD | Admit: 2022-03-03 | Discharge: 2022-03-03 | Disposition: A | Discharge status: Home / Self Care | Payer: 59 | Attending: Psychiatry | Admitting: Psychiatry

## 2022-03-03 NOTE — H&P (Signed)
Behavioral Health Medical Screening Exam  Laura Lucas is an 31 y.o. female with past history of anxiety, major depression who presented to [facility] with her boyfriend for assessment of anxiety, suicidal thoughts, worthlessness, unable to get out of the bed.   Pt reports being seen by her NP earlier in the day after what she describes as a "funk" where she was referred to [facility] for inpatient.   Per notes from [facility] HPI: "Patient reports the funk phase started on Wednesday and has been taking Ativan daily since then. Patient is not using Lunesta for sleep but is using Benadryl instead. Patient expresses concerns about taking sleepin pills due to past difficulties with withdrawal. Patient mentions being triggered at work this week, which may have contributed to their current mental state. She currently is unable to drive due to thoughts of self-harming and using her car to kill herself".   Patient reports having recent medication changes (increased Vraylar, decreased Cymbalta) and feels she may be experiencing residual effects as a result. She states telling her provider she wanted her boyfriend to drive her was for "support" and not for safety. She denies any active or passive suicidal ideations. Reports being in a "funk" and some feelings of "overwhelmed" after receiving influx of hospital bills and recently being triggered at work after a Production designer, theatre/television/film posted herself in hospital receiving chemo. Recently completing 16 week DBT group therapy with [facility] in [place].   She denies any ideas, intent, or plan to harm herself or anyone. Discussed revisiting IOP services with [facility] as she was not pleased with previous IOP provider. She denies any access to weapons and provided verbal permission to obtain collateral information from her boyfriend.    Collateral: Karsten Ro (in lobby) States he and patient recently returned from beach trip where he denies any safety concerns with patient.  Reports being in contact with patient's parents in [place]. States today. Plan for patient to return to his home. Denies any access to weapons or concerns of patient harming herself or others. States he transported pt as a support with plan for patient to remain in the home with him.    Total Time spent with patient: 30 minutes  Psychiatric Specialty Exam: Physical Exam Vitals and nursing note reviewed.  Constitutional:      General: She is not in acute distress.    Appearance: She is normal weight. She is not ill-appearing or toxic-appearing.  HENT:     Head: Normocephalic.     Nose: Nose normal.     Mouth/Throat:     Mouth: Mucous membranes are moist.     Pharynx: Oropharynx is clear.  Eyes:     Pupils: Pupils are equal, round, and reactive to light.  Cardiovascular:     Rate and Rhythm: Normal rate.     Pulses: Normal pulses.  Pulmonary:     Effort: Pulmonary effort is normal.  Abdominal:     Palpations: Abdomen is soft.  Musculoskeletal:        General: Normal range of motion.     Cervical back: Normal range of motion.  Skin:    General: Skin is warm.  Neurological:     Mental Status: She is alert and oriented to person, place, and time. Mental status is at baseline.  Psychiatric:        Attention and Perception: Attention and perception normal.        Mood and Affect: Mood and affect normal.  Speech: Speech normal.        Behavior: Behavior is cooperative.        Thought Content: Thought content is not paranoid or delusional. Thought content does not include homicidal or suicidal ideation. Thought content does not include homicidal or suicidal plan.        Cognition and Memory: Cognition and memory normal.        Judgment: Judgment normal.    Review of Systems  Psychiatric/Behavioral:  Positive for decreased concentration. Negative for behavioral problems, dysphoric mood, self-injury and suicidal ideas. The patient is nervous/anxious.   All other systems  reviewed and are negative.  Blood pressure 112/82, pulse 86, temperature 97.8 F (36.6 C), temperature source Oral, resp. rate 20, SpO2 99 %.There is no height or weight on file to calculate BMI. General Appearance: Casual Eye Contact:  Good Speech:  Clear and Coherent Volume:  Normal Mood:  Euthymic Affect:  Congruent Thought Process:  Coherent Orientation:  Full (Time, Place, and Person) Thought Content:  Logical Suicidal Thoughts:  No Homicidal Thoughts:  No Memory:  Immediate;   Good Recent;   Good Judgement:  Intact Insight:  Present Psychomotor Activity:  Normal Concentration: Concentration: Fair and Attention Span: Fair Recall:  [employer] of Knowledge:Fair Language: Good Akathisia:  NA Handed:  Right AIMS (if indicated):    Assets:  Communication Skills Desire for Improvement Financial Resources/Insurance Housing Physical Health Resilience Social Support Transportation Vocational/Educational Sleep:     Musculoskeletal: Strength & Muscle Tone: within normal limits Gait & Station: normal Patient leans: N/A  Blood pressure 112/82, pulse 86, temperature 97.8 F (36.6 C), temperature source Oral, resp. rate 20, SpO2 99 %.  Recommendations: Based on my evaluation the patient does not appear to have an emergency medical condition. Patient denies any suicidal or homicidal ideations; contracts for safety. Discharged with plan to follow up with [facility] for outpatient therapy services.   Loletta Parish, NP 03/03/2022, 6:39 PM

## 2022-04-03 NOTE — ED Provider Notes (Signed)
-------------------------------------------------------------------------------  Attestation signed by Etta Quill, MD at 04/04/2022  2:54 PM  I saw and evaluated the patient and reviewed the resident's note.  I agree with resident's findings and plan.  Patient's presentation is most consistent with severe exacerbation of chronic illness.  Patient with psychiatric illness hx, reports to me her statements were to "get my family to come down here to help me" and not with intent to harm self.  Consult to psychiatry, they have seen and determined patient not danger to self or others, stable for outpatient.  -------------------------------------------------------------------------------    Transfer of Care Note  I assumed care of Caroline Blanchard on 04/04/2022 at 2300 from Dr. Elisabeth Pigeon    Briefly, Caroline Blanchard is a 31 y.o. female who presented for suicidal ideation. The signout from the previous ED provider included:    ED Course as of 04/04/22 0024   Mon Apr 03, 2022   2258 30 yoF, IVC by family medicine doc. SI with a plan. Psych consulted.  [JN]      ED Course User Index  [JN] [place] Elizabeth Nogle, DO         Please refer to the original providers note for additional information regarding the care of Caroline Blanchard.    Reassessment:  Vital Signs:  The most current vitals were   Vitals:    04/03/22 2038 04/03/22 2220   BP: 124/80    Pulse: 89    Temp: 98 F (36.7 C)    Resp: 20    SpO2: 98%    PainSc:  0-Zero   Height: 1.626 m (5\' 4" )    Weight: 81.6 kg (180 lb)    BMI (Calculated): 31      Hemodynamics:  The patient is hemodynamically stable.  Mental Status:  The patient is alert    Additional MDM:  Caroline Blanchard is a 31 y.o. female here with suicidal ideation.     Psychiatry evaluated the patient did not feel that she met criteria for inpatient admission at this time.  Patient is very close follow-up with outpatient psychiatry resources and has an appointment tomorrow.  They  discovered that the patient has a history of borderline personality disorder and psychiatric team was able to gain collateral from the patient's mother was able to determine that the patient's suicidal ideation was passive in nature and the patient's mother endorsed that she thought that it was likely secondary to behavior seeking.  Patient's mother is not concerned and feels that she is able to contract for safety.  The patient also endorsed that her suicidal ideation was passive and she has no active suicidal ideation at this time.  She also endorsed that this was attention seeking.    Per psychiatry, patient has extremely close psychiatric follow-up with outpatient psychiatric resources and is in a outpatient program with daily evaluation.  Felt that the patient was safe for discharge at this time, IVC will be overturned by psychiatry.    Discharge: Patient is felt to be medically appropriate for discharge at this time. Patient was informed of all pertinent physical exam, laboratory, and imaging findings. Patients suspected etiology of their symptom presentation was discussed with the patient and all questions were answered. Patient was instructed to follow up with their psychiatrist  in 1 day for re-evaluation. Patient was given strict return precautions.       The plan for this patient was discussed with Dr. Theresia Lo , who voiced agreement and who oversaw evaluation  and treatment of this patient.     Diagnosis:  1. Suicidal ideation        ED Disposition     ED Disposition   Discharge    Condition   Stable    Comment   Caroline Blanchard discharge to home/self care.                            Electronically signed by: [place] Elizabeth Nogle, DO  Resident  04/04/22 [phone]       Electronically signed by: Etta Quill, MD  04/04/22 [phone]

## 2022-06-03 ENCOUNTER — Other Ambulatory Visit: Payer: Self-pay

## 2022-06-20 ENCOUNTER — Other Ambulatory Visit: Payer: Self-pay | Admitting: Gastroenterology

## 2022-06-20 ENCOUNTER — Other Ambulatory Visit
Admission: RE | Admit: 2022-06-20 | Discharge: 2022-06-20 | Disposition: A | Discharge status: Home / Self Care | Payer: 59 | Source: Ambulatory Visit | Attending: Physician Assistant | Admitting: Physician Assistant

## 2022-06-20 DIAGNOSIS — F411 Generalized anxiety disorder: Secondary | ICD-10-CM | POA: Insufficient documentation

## 2022-06-20 DIAGNOSIS — Z124 Encounter for screening for malignant neoplasm of cervix: Secondary | ICD-10-CM | POA: Insufficient documentation

## 2022-06-21 ENCOUNTER — Other Ambulatory Visit
Admission: RE | Admit: 2022-06-21 | Discharge: 2022-06-21 | Disposition: A | Discharge status: Home / Self Care | Payer: 59 | Source: Ambulatory Visit | Attending: Physician Assistant | Admitting: Physician Assistant

## 2022-06-21 DIAGNOSIS — Z01419 Encounter for gynecological examination (general) (routine) without abnormal findings: Secondary | ICD-10-CM | POA: Insufficient documentation

## 2022-06-21 LAB — CBC
Hematocrit: 40 % (ref 34–49)
Hemoglobin: 12.9 g/dL (ref 11.2–16.0)
MCH: 29 pg (ref 26–32)
MCHC: 33 g/dL (ref 32–36)
MCV: 88 fL (ref 75–100)
Platelets: 252 10*3/uL (ref 150–450)
RBC: 4.5 MIL/uL (ref 4.0–5.5)
RDW: 12.9 % (ref 0.0–15.0)
WBC: 5.5 10*3/uL (ref 3.5–11.0)

## 2022-06-21 LAB — VITAMIN D: 25-OH Vit Total: 35 ng/mL (ref 30–60)

## 2022-06-21 LAB — T4: T4: 7.4 ug/dL (ref 4.6–12.0)

## 2022-06-21 LAB — HDL CHOLESTEROL: HDL: 45 mg/dL (ref 40–60)

## 2022-06-21 LAB — FREE AND TOTAL TESTOSTERONE (TOTAL 12-1500 NG/DL; FREE 0.02-40.00 PG/ML)
Testosterone,% Free: 2 %
Testosterone,Free: 3 pg/mL
Testosterone: 19 ng/dL (ref 13–48)

## 2022-06-21 LAB — CHOLESTEROL, TOTAL: Cholesterol: 168 mg/dL

## 2022-06-21 LAB — SEX HORMONE BINDING GLOBULIN: Sex Hormone Binding Glob: 40 nmol/L (ref 20–130)

## 2022-06-21 LAB — PROLACTIN: Prolactin: 14 ng/mL

## 2022-06-21 LAB — HEMOGLOBIN A1C: Hemoglobin A1C: 5.1 %

## 2022-06-21 LAB — TSH: TSH: 0.93 u[IU]/mL (ref 0.27–4.20)

## 2022-06-21 LAB — ESTRADIOL: Estradiol: 54 pg/mL

## 2022-06-21 LAB — FOLLICLE STIMULATING HORMONE: FSH: 6 m[IU]/mL

## 2022-06-21 LAB — DHEA-SULFATE: DHEA Sulfate: 108 ug/dL (ref 99–340)

## 2022-06-21 LAB — LDL CHOLESTEROL, DIRECT: LDL Direct: 111 mg/dL

## 2022-06-23 LAB — GYN CYTOLOGY

## 2022-07-14 ENCOUNTER — Ambulatory Visit: Payer: Self-pay | Admitting: Obstetrics & Gynecology

## 2022-10-17 ENCOUNTER — Other Ambulatory Visit: Payer: Self-pay | Admitting: Surgery

## 2022-10-18 LAB — VAGINITIS SCREEN: DNA PROBE
Candida Species: NEGATIVE
Gardnerella vaginalis: POSITIVE — AB
Trichomonas vaginosis: NEGATIVE

## 2022-10-18 LAB — CHLAMYDIA NAAT (PCR)
Chlamydia Plasmid DNA Amplification: NOT DETECTED
N. gonorrhoeae DNA Amplification: NOT DETECTED

## 2022-10-18 LAB — AEROBIC CULTURE

## 2022-10-19 LAB — RESOLUTION

## 2023-02-16 ENCOUNTER — Other Ambulatory Visit
Admission: RE | Admit: 2023-02-16 | Discharge: 2023-02-16 | Disposition: A | Discharge status: Home / Self Care | Payer: No Typology Code available for payment source | Source: Ambulatory Visit

## 2023-02-16 DIAGNOSIS — T781XXA Other adverse food reactions, not elsewhere classified, initial encounter: Secondary | ICD-10-CM | POA: Insufficient documentation

## 2023-02-16 DIAGNOSIS — Y9289 Other specified places as the place of occurrence of the external cause: Secondary | ICD-10-CM | POA: Insufficient documentation

## 2023-02-16 DIAGNOSIS — X58XXXA Exposure to other specified factors, initial encounter: Secondary | ICD-10-CM | POA: Insufficient documentation

## 2023-02-16 DIAGNOSIS — T7849XA Other allergy, initial encounter: Secondary | ICD-10-CM | POA: Insufficient documentation

## 2023-02-19 LAB — MILK, COW RAST: Milk, Cow IgE: <0.1 kU/L

## 2023-02-19 LAB — ALLERGEN GLUTEN IGE: Gluten IgE: <0.1 kU/L

## 2023-02-19 LAB — ALLERGEN CORN: Corn(Maize) IgE: <0.1 kU/L

## 2023-02-19 LAB — ALLERGEN CAT EPI AND DANDER IGE: Cat Epi/Dander IgE: <0.1 kU/L

## 2023-02-19 LAB — ALLERGEN EGG WHITE: Egg White IgE: <0.1 kU/L

## 2023-02-19 LAB — ALLERGEN CASEIN: Casein IgE: <0.1 kU/L

## 2023-02-19 LAB — DF MITES IGE: DF Mites IgE: <0.1 kU/L

## 2023-02-19 LAB — ALLERGEN DOG DANDER: Dog Dander: <0.1 kU/L

## 2023-02-19 LAB — ALLERGEN TOMATO: Tomato IgE: <0.1 kU/L

## 2023-02-19 LAB — RAST INTERPRETATION

## 2023-02-19 LAB — ALLERGEN ORANGE IGE: Orange IgE: <0.1 kU/L

## 2023-02-19 LAB — ALLERGEN STRAWBERRY: Strawberry IgE: <0.1 kU/L

## 2023-02-19 LAB — DP MITES IGE: DP Mites IgE: <0.1 kU/L

## 2023-03-06 ENCOUNTER — Other Ambulatory Visit
Admission: RE | Admit: 2023-03-06 | Discharge: 2023-03-06 | Disposition: A | Discharge status: Home / Self Care | Payer: No Typology Code available for payment source | Source: Ambulatory Visit | Attending: Obstetrics | Admitting: Obstetrics

## 2023-03-06 DIAGNOSIS — Z112 Encounter for screening for other bacterial diseases: Secondary | ICD-10-CM | POA: Insufficient documentation

## 2023-03-06 DIAGNOSIS — Z113 Encounter for screening for infections with a predominantly sexual mode of transmission: Secondary | ICD-10-CM | POA: Insufficient documentation

## 2023-03-06 DIAGNOSIS — R350 Frequency of micturition: Secondary | ICD-10-CM | POA: Insufficient documentation

## 2023-03-06 DIAGNOSIS — Z118 Encounter for screening for other infectious and parasitic diseases: Secondary | ICD-10-CM | POA: Insufficient documentation

## 2023-03-06 LAB — AEROBIC CULTURE: Aerobic Culture: 0

## 2023-03-07 LAB — CHLAMYDIA NAAT (PCR): Chlamydia NAAT (PCR): NEGATIVE

## 2023-03-07 LAB — N. GONORRHOEAE NAAT (PCR): N. gonorrhoeae NAAT (PCR): NEGATIVE

## 2023-03-07 LAB — VAGINITIS SCREEN: DNA PROBE: Vaginitis Screen:DNA Probe: 0

## 2023-03-14 NOTE — Progress Notes (Signed)
Returned patient voicemail shared wait list. Placed patient on wait list.

## 2023-04-26 ENCOUNTER — Ambulatory Visit: Payer: No Typology Code available for payment source | Admitting: Internal Medicine

## 2023-05-02 ENCOUNTER — Ambulatory Visit: Payer: No Typology Code available for payment source | Admitting: Internal Medicine

## 2023-05-03 ENCOUNTER — Ambulatory Visit: Payer: No Typology Code available for payment source | Admitting: Urology

## 2023-05-03 LAB — UNMAPPED LAB RESULTS
Basophil # (HT): 0.1 10 3/uL (ref 0.0–0.2)
Basophil % (HT): 1 % (ref 0–2)
Eosinophil # (HT): 0.1 10 3/uL (ref 0.0–0.5)
Eosinophil % (HT): 2 % (ref 0–7)
Hematocrit (HT): 43 % (ref 34–47)
Hemoglobin (HGB) (HT): 14.5 g/dL (ref 11.5–16.0)
Lymphocyte # (HT): 3.3 10 3/uL (ref 0.9–3.8)
Lymphocyte % (HT): 37 % (ref 17–44)
MCHC (HT): 34 g/dL (ref 32.0–36.0)
MCV (HT): 87.1 fL (ref 81.0–99.0)
Mean Corpuscular Hemoglobin (MCH) (HT): 29.7 pg (ref 26.0–34.0)
Monocyte # (HT): 0.6 10 3/uL (ref 0.2–1.0)
Monocyte % (HT): 7 % (ref 4–12)
Neutrophil # (HT): 4.6 10 3/uL (ref 1.5–7.7)
Platelets (HT): 259 10 3/uL (ref 150–450)
RBC (HT): 4.89 10 6/uL (ref 3.80–5.20)
RDW (HT): 12.1 % (ref 11.5–15.0)
Seg Neut % (HT): 53 % (ref 40–75)
WBC (HT): 8.7 10 3/uL (ref 4.0–10.8)

## 2023-05-04 ENCOUNTER — Ambulatory Visit: Payer: No Typology Code available for payment source | Admitting: Primary Care

## 2023-05-10 ENCOUNTER — Ambulatory Visit
Payer: No Typology Code available for payment source | Attending: Student in an Organized Health Care Education/Training Program | Admitting: Primary Care

## 2023-05-10 ENCOUNTER — Encounter: Payer: Self-pay | Admitting: Primary Care

## 2023-05-10 ENCOUNTER — Other Ambulatory Visit: Payer: Self-pay

## 2023-05-10 VITALS — BP 120/70 | HR 82 | Temp 97.9°F | Ht 64.5 in | Wt 179.6 lb

## 2023-05-10 DIAGNOSIS — F32A Depression, unspecified: Secondary | ICD-10-CM

## 2023-05-10 DIAGNOSIS — Z0000 Encounter for general adult medical examination without abnormal findings: Secondary | ICD-10-CM

## 2023-05-10 DIAGNOSIS — F419 Anxiety disorder, unspecified: Secondary | ICD-10-CM

## 2023-05-10 NOTE — Progress Notes (Signed)
Adult Partial Hospitalization Program  Clinical Management Note     Tarisa Radi  [id number]  05/10/2023    Referral: Adult Partial Hospitalization   Date: 05-10-2023     Referring Person     Referring Name Jacquiline Doe, PMHNP   Referring Phone [phone]   Referring Email [email or web address]   Fax # to send discharge summary to [phone]   Referring Agency/Program Other (Please Specify)   Other Agency Jacquiline Doe, NP in Psychiatry [facility]   Upload Clinical [facility] referral must be accompanied by a current clinical summary.  [[email or web address] (1).pdf]   Patient Information     Last Name [facility]   First Name [facility]   Date of Birth 1990-12-01   Address [address] [place], [place], Zip Code [place] [address]   Phone [phone]   Insurance Provider [employer] [place]   Insurance Number [id number]   Patient's Chief Complaint Depression   Referrer's reason for referral and goal of PHF admission Decline in conditions as evidence by patient report and symptom presentation. Goal: coping skills and improvement in overall symptoms of depression.    Current Diagnostic Impression F33.1 Major Depressive Disorder F41.1 Generalized Anxiety Disorder   Risk Factors     Suicidal ideation  Current  Passive, no plan or intent.    Suicide attempt  Past  2022 OD    Self Injury     Homicidal ideation    Affective instability  Current  Emotionally labile, irritability.    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    Alcohol: Date of Last Use  Alcohol: Most recent amount, frequency, duration    Substance Use       Substance Ability to stop/motivation to address in treatment?    Name Substance    Substance: Date of Last Use    Substance: Most recent amount, frequency, duration    2. Name Substance    2. Substance: Date of Last Use    2. Substance: Most recent amount, frequency, duration    3. Name Substance    3. Substance: Date of Last Use    3. Substance: Most recent amount, frequency, duration    4. Name Substance    4. Substance: Date of Last Use    4. Substance: Most recent amount, frequency, duration    Medications     1. Name of Medication lamotrigine    Dose/Frequency and Duration 125mg  QD   2. Name of Medication lorazepam    Dose/Frequency and Duration 1mg  QD PRN   3. Name of Medication    Dose/Frequency & Duration    4. Name of Medication    Dose/Frequency & Duration    Past MedicationsPlease enter name of medication, dose/frequency & reason for discontinuation.  Trazodone (ineffective), gabapentin (drowsy), hydroxyzine (  poor efficacy), Wellbutrin (anxiety), duloxetine, Abilify (weight gain), Prozac, Zoloft   Allergies/Adverse Drug Reactions NKDA   Are there any recommendations from the patient's prescribing provider for medication changes while in PHP? None   Current Treatment Providers     Psychiatrist Jacquiline Doe, PMHNP-BC   Psychiatrist Phone [phone]   Therapist [name]   Therapist Phone [phone]   Care Manager    Care Manager Phone    Primary Care Doctor    Primary Care Doctor Phone    Complete     Complete? Complete       Client is a 32yo Caucasian female with a hx of depression and anxiety.  Endorses a significant struggle with anxiety and panic. Reports that lorazepam causes  irritability and suicidal thoughts, no plan or intent. Experiencing panic attacks weekly for the  past few months. Reports that her symptoms of panic and depression have worsened in  the past 2 weeks.  Symptomatic COVID, HA. Feeling hopeless. Wakes up with anger and  hate. Takes Benadryl nightly for sleep.  Therapy: Weekly, DBT, Kristopher Oppenheim  denies SI/HI, self harm, AVH, OCD, EDO, mania hypomania, irritability, impulsivity.    Psychiatric History Trazodone (ineffective), gabapentin (drowsy), hydroxyzine (poor efficacy), Wellbutrin  (anxiety), duloxetine, Abilify (weight gain), Prozac, Zoloft  In high school became very depressed, tearful, did not engage with others, poor self-worth/  image, excess sleeping. At the time her parents brought her to a psychiatrist. After HS she  moved to [place] [facility] [employer] [facility] [place] [facility] for college, saw a counselor at the school. At [facility] saw a psychiatrist and  was on Prozac, then PRN Xanax, reports that at the time she was stalked by an ex which  led to court. Moved to [place] for a job, stressful, began experiencing HA, eventually had her  first panic attack ever. Medical w/u for HA were negative (CT and MRI). Moved back to [place]  as she was unhappy in [place]. For the years following, felt stable on Zoloft. The pandemic  began, changes at work occurred, as well as social stressors. She then became acutely  anxious, excess worry. At the time stopped Zoloft and restarted Prozac, clonazepam,  doxepin. For 8 months relied on clonazepam. A series of stressors: grandmother died,  childhood home was sold, family stress, which led to an OD on clonazepam.  January 2022 "Things spiraled," placed on QD clonazepam, doxepin, Zoloft to Prozac.  Reports that for the year following, she relied on clonazepam. Experience intense anger.  In 2022 attempted to OD on clonazepam. In the hospital transitioned to gabapentin.  September 2022 6 IV Ketamine treatments. After Ketamine treatments was placed on  duloxetine, trazodone, PRN hydroxyzine, and PRN Ativan. October 2022 was IP Psych  due to SI, dc on Cymbalta, Trazodone, Abilify, and Ativan PRN. November 2022 had a  hormonal IUD placed, but still struggled with mental health. In January 2023,  Cymbalta was  increased. In March 2023 IP Psych. April 2023 PHP and IOP.  Hx of OD on  clonazepam  Soc Hx Occasional drink  Caffeine - 1 cup per day  Vape - Denies  [facility]  [address], [place], [place] [id number]  Psychiatric Follow-up Note  RAHEL MOTTOLA  MRN :  [place] Ouch : 1991/04/13 Phone :  Visited on: 2024 Aug 28 10:00 (Age at visit: 31 years) Electronically signed by: Aquilla Hacker, PMHNP-BC on 2023-04-25 04:27 PM  Printed on: 2024 Sep 12 07:05 Page 2 of 3  Note created using Dianne Dun  From [place], recently moved back to live with her parents due to mental health  concerns. Attended college in [place]  Medical History Birth Control - IUD: Lyleta  Severe headaches, migraines  Last Physical: - 1 year ago  Medications LORazepam 1 mg tablet, Take 1 tab orally once daily as needed for anxiety. MDD 1 tab.  lamoTRIgine 25 mg tablet, Take 1 tab orally once daily. TDD 125mg .  lamoTRIgine 100 mg tablet, Take 1 tab orally once daily.  doxepin 10 mg capsule, 1 cap(s) orally once a day  Allergies No known allergies  Vitals Height/Length Weight BMI Blood Pressure Temperature Pulse RR SpO2  5\' 4"  180 lbs 0 oz 30.90  Recorded: 2024 Jun 26 10:36 by: ASIA TODD  MSE The patient's speech was normal, sharing conversation with normal laryngeal efforts.  Appropriate mood and affect were seen on exam. Thought processes were logical,  relevant, and thoughts were completed normally. Thought content was normal with no  psychotic or suicidal thoughts. The patient's judgement was realistic with normal insight  into their present condition. Mental status included: correct time, place, person orientation,  normal recent and remote memory, normal attention span and concentration ability.  Language skills included the ability to correctly name objects. Fund of knowledge included  normal awareness of current and past events.  DSM-5 F33.1 Major Depressive Disorder  F41.1 Generalized Anxiety Disorder  Tests  PHQ-9: 24  GAD-7: 21  Assessment &  Adherence  Depression: Chronic; moderate  Anxiety: Chronic; high severity  No acute safety concerns  Endorses adherence with medication  AIMS (0)  PDMP Search Terms: Maya Caler, [birth year]Search Date: 04/25/2023 10:01:17 AM The  Drug Utilization Report below displays all of the controlled substance prescriptions, if any,  that your patient has filled in the last twelve months. The information displayed on this  report is compiled from pharmacy submissions to the Department, and accurately reflects  the information as submitted by the pharmacies. This report was requested by: Lorenso Quarry  Reference #: [id number]  [facility]  [address], [place], [place] [id number]  Psychiatric Follow-up Note  IREANNA TAIWO  MRN :  [place] Ouch : [birth year] Phone :  Visited on: 2024 Aug 28 10:00 (Age at visit: 31 years) Electronically signed by: Aquilla Hacker, PMHNP-BC on 2023-04-25 04:27 PM  Printed on: 2024 Sep 12 07:05 Page 3 of 3  Note created using Dianne Dun  Plan -Increase Lamictal to 150mg   -Initiate Lexapro for anxiety  -Consider Latuda should irritability and depression persist/worsen  -Increase frequency of therapy  -HIPAA for therapy  -DBT group at [facility]: [email or web address]  MEDICATIONS:  lamoTRIgine 150 mg tablet, 1 tab(s) orally once a day (Prescribed by ASIA TODD on 28  Aug, 2024 at 10:22 AM)  escitalopram 5 mg tablet, 1 tab(s) orally once a day (Prescribed by ASIA TODD on 28  Aug, 2024 at 10:22 AM)  Xanax 0.25 mg tablet, 1 tab(s) orally once a day as needed for anxiety. MDD 1 tab.  (Prescribed by ASIA TODD on 28 Aug, 2024 at 10:23 AM)  Psychoeducation PATIENT EDUCATION:  Lamotrigine Oral Tablet (LAMOTRIGINE - ORAL)(Shared by ASIA TODD on 19 Oct, 2023  at 12:24 PM)  Lorazepam Oral Tablet (LORAZEPAM - ORAL)(Shared by ASIA TODD on 26 Jun, 2024 at  10:38 AM)  Doxepin Oral Capsule (DOXEPIN CAPSULE - ORAL)(Shared by ASIA TODD on 26 Jun,  2024 at  10:38 AM)  Risk v benefit of medication, including side/adverse effects of medication discussed.  Patient  verbalized understanding and is in favor of the plan.  Next Appointment 03 May 2023 01:00 PM  Psychotherapy  Psychological Problem or Stressor Addressed: Grieving the estrangement of a family  member  Intervention(s) or Handout Clinician Provided: Supportive therapy  Patient Response/Observation: Client was receptive  Measurable Goals: Reduction of anxiety by 50% in 6 months  Total Time: 20 minutes  Total Time

## 2023-05-10 NOTE — Progress Notes (Addendum)
[facility] - New Patient Note    CC: Establish care    HPI:   Caroline Blanchard is a 32 y.o. female with past medical history of anxiety, anemia, and depression.     Caroline Blanchard is here to establish care. Patient was living out of state and returned to back to the [place] area to live with her parents in September 2023. Caroline Blanchard is currently working part-time but does have increase stressors from the work front.     Prior to her moving back to [place], patient was being treated for anxiety and depression and did have one inpatient admission therapy for depression and suicidal ideation. Patient has been hospitalized twice for major depression. Patient does endorse that anxiety is more bothersome then the depression. Per patient was on an increase number of medications and the therapy was stopped and the therapist have started to reintroduce medications to assist with combating both anxiety an depression.     Patient dos state that she is tired at all times of the day. Patient is struggling with sleep, and gets 4-6 hours nightly but is broke do to living arrangements. When patient does wakens is still very tired and angry at times due to not being able to complete the needed sleep. Patient does state that sleep is broke from the need to urinate and is unable to return to sleep unless void.     Patient is getting headaches and is taking Tylenol with some results. Patient is having panic attacks at work and is unable to function and becomes emotional. With the panic attacks patient feels dizzy and at times feels like she is going to faint. Patient does have a consular that she is able to meet with once weekly and then a Psychiatrist every 2 weeks.    Patient does endorse eating a well balanced diet that includes both fruit and vegetables. Patient maintains hydration with water.     At baseline patient is very active and does work out often. Due to the anxiety and increase in depression patient and  the increasing of tired patient has not been exercising. Patient does state that stress levels are high and at times unbearable. Patient does state there are additional stressors from family dynamics.       PMH:   Past Medical History:   Diagnosis Date    Anemia     Anxiety     Depression         PSH: No past surgical history on file.    Allergies:   Allergies as of 05/10/2023    (No Known Allergies (drug, envir, food or latex))        Home Meds:   Prior to Admission medications    Medication Sig Start Date End Date Taking? Authorizing Provider   escitalopram 5 mg tablet Take 1 tablet (5 mg total) by mouth daily. 04/25/23  Yes [provider]   lamoTRIgine (LAMICTAL) 150 mg tablet Take 1 tablet (150 mg total) by mouth daily. 04/25/23  Yes [provider]   ALPRAZolam 0.25 mg tablet Take 1 tablet (0.25 mg total) by mouth daily as needed. Max daily dose: 0.25 mg 04/25/23  Yes [provider]   diphenhydrAMINE HCl (BENADRYL PO) Take 25 mg by mouth daily.    [provider]   Acetaminophen (TYLENOL PO) Take 1,000 mg by mouth daily.    [provider]        SH:   Social History  Socioeconomic History    Marital status: Single   Tobacco Use    Smoking status: Never    Smokeless tobacco: Never   Substance and Sexual Activity    Alcohol use: Not Currently    Drug use: Never    Sexual activity: Not Currently          FH:   Family History   Problem Relation Age of Onset    Anxiety disorder Mother     Depression Mother     Thyroid disease Sister     Depression Sister     Anxiety disorder Sister     Depression Maternal Grandmother     Anxiety disorder Maternal Grandmother     Depression Maternal Grandfather     Anxiety disorder Maternal Grandfather     Diabetes Paternal Grandfather         Review of Systems   Constitutional:  Positive for malaise/fatigue. Negative for chills, fever and weight loss.   Respiratory:  Negative for cough and shortness of breath.    Cardiovascular:   Negative for chest pain, palpitations and leg swelling.   Gastrointestinal:  Positive for nausea and vomiting. Negative for constipation, diarrhea and heartburn.   Genitourinary:  Negative for dysuria, frequency and urgency.   Musculoskeletal:  Negative for falls.   Neurological:  Positive for dizziness and headaches.   Psychiatric/Behavioral:  Positive for depression. Negative for hallucinations, memory loss, substance abuse and suicidal ideas. The patient is nervous/anxious and has insomnia.              Physical exam:  Vitals:    05/10/23 1526   BP: 120/70   Pulse: 82   Temp: 36.6 C (97.9 F)   SpO2: 98%   Weight: 81.5 kg (179 lb 9.6 oz)   Height: 1.638 m (5' 4.5")                                           Body mass index is 30.35 kg/m.   BP Readings from Last 3 Encounters:   05/10/23 120/70     Wt Readings from Last 3 Encounters:   05/10/23 81.5 kg (179 lb 9.6 oz)         Physical Exam  Vitals and nursing note reviewed.   Constitutional:       Appearance: Normal appearance. She is normal weight.   HENT:      Right Ear: Tympanic membrane, ear canal and external ear normal.      Left Ear: Tympanic membrane, ear canal and external ear normal.      Nose: Rhinorrhea present. No congestion.      Mouth/Throat:      Mouth: Mucous membranes are moist.      Pharynx: No oropharyngeal exudate or posterior oropharyngeal erythema.   Cardiovascular:      Rate and Rhythm: Normal rate and regular rhythm.      Pulses: Normal pulses.      Heart sounds: Normal heart sounds.   Pulmonary:      Effort: Pulmonary effort is normal.      Breath sounds: Normal breath sounds.   Abdominal:      General: Abdomen is flat. Bowel sounds are normal. There is no distension.      Palpations: Abdomen is soft. There is no mass.      Tenderness: There is no abdominal tenderness.  Hernia: No hernia is present.   Musculoskeletal:         General: Normal range of motion.   Skin:     General: Skin is warm and dry.   Neurological:      General: No  focal deficit present.      Mental Status: She is alert and oriented to person, place, and time.   Psychiatric:      Comments: Patient is very emotional, patient is able to express strong feelings of anxiety.  Work life is increasing stressors.  Patient is having in crease depression secondary to moving back in with parents, job stresses, and          General: Pleasant. Alert/interactive. NAD.  HEENT: MMM, anicteric, oropharynx benign  Neck: No LAD. No JVD.  Cor: RRR, no M/R/G  Pulm: Normal WOB, CTA-B  Abd: Normal appearing, ND, soft, NTTP, no organomegaly or masses  Ext: WWP, 2+ peripheral pulses, no edema  Neuro: AOx3, speech/language WNL, moving all extremities, strength/sensation grossly intact. Coordination/gait not formally tested.  Skin: Benign, no rashes, ecchymoses, ulcers    Labs:   Reviewed, notable for:  N/a    EKG:  N/a    Radiology:  No results found.    There are no hospital problems to display for this patient.          Counselling:  -Health care proxy discussed and given to patient. in chart.  -Lab results reviewed and discussed.  -Diet, weight, exercise reviewed and discussed.    -Skin cancer awareness and prevention discussed.  -OB/GYN care discussed-sees Dr.   -STD risk counseling, safe sex, contraception discussed.  -Routine eye and dental care discussed.  -Motor vehicle/ sports safety discussed.  -Working smoke/ CO detectors discussed.    - Do you feel safe at home?  -Gun safety discussed. No guns at home.   Annual visit, month of their birth      Assessment/Plan:     1. Preventative health care  Patient has no current labs results, will order.   - TSH; Future  - Vitamin D; Future  - Vitamin B12; Future  - Hemoglobin A1c; Future    2. Anxiety and depression  Currently neither anxiety and or depression are stable. Patient is closely followed by both her consular and psychiatry. Psychiatry is managing medications. I did review with patient emergency numbers in the event is needed. Patient is  currently living with parent who are able to provide support when needed. I did write a letter for patient to be out of work due to the increase stresses that her supervisor places on her. Patient denies any thought os self harm and denies having a plan.      Follow up in about 2 weeks (around 05/24/2023).    Signed by Dolphus Jenny, NP 05/11/2023 at 1:04 PM    This note was created using Dragon dictation software. The note was reviewed, but may contain transcription errors or typographical errors.

## 2023-05-14 ENCOUNTER — Telehealth: Payer: Self-pay | Admitting: Pediatrics

## 2023-05-14 ENCOUNTER — Other Ambulatory Visit
Admission: RE | Admit: 2023-05-14 | Discharge: 2023-05-14 | Disposition: A | Discharge status: Home / Self Care | Payer: No Typology Code available for payment source | Source: Ambulatory Visit | Attending: Primary Care | Admitting: Primary Care

## 2023-05-14 DIAGNOSIS — Z0000 Encounter for general adult medical examination without abnormal findings: Secondary | ICD-10-CM | POA: Insufficient documentation

## 2023-05-14 LAB — VITAMIN D: 25-OH Vit Total: 29 ng/mL — ABNORMAL LOW (ref 30–60)

## 2023-05-14 LAB — VITAMIN B12: Vitamin B12: 1839 pg/mL — ABNORMAL HIGH (ref 232–1245)

## 2023-05-14 LAB — TSH: TSH: 1.88 u[IU]/mL (ref 0.27–4.20)

## 2023-05-14 LAB — HEMOGLOBIN A1C: Hemoglobin A1C: 5.1 %

## 2023-05-14 NOTE — Telephone Encounter (Signed)
-----   Message from Dolphus Jenny, NP sent at 05/14/2023  4:00 PM EDT -----  I reviewed your labs, your Vitamin B12 level is high. Please stop taking your supplement of Vitamin B 12.   Thank you Dolphus Jenny NP

## 2023-05-14 NOTE — Telephone Encounter (Signed)
Patient is aware of results and instructions.

## 2023-05-15 ENCOUNTER — Encounter: Payer: Self-pay | Admitting: Primary Care

## 2023-05-15 DIAGNOSIS — E559 Vitamin D deficiency, unspecified: Secondary | ICD-10-CM

## 2023-05-15 DIAGNOSIS — Z0000 Encounter for general adult medical examination without abnormal findings: Secondary | ICD-10-CM

## 2023-05-17 NOTE — Progress Notes (Signed)
[facility]ult [facility] Hospitalization Program  Clinical Management Note     Caroline Blanchard  [id number]  05/17/2023    Triage Coordinator Note:  Message left for patient to provide insurance information obtained from the financial counselor and to offer an intake appointment.

## 2023-05-21 ENCOUNTER — Encounter: Payer: Self-pay | Admitting: Primary Care

## 2023-05-22 ENCOUNTER — Ambulatory Visit: Payer: No Typology Code available for payment source | Attending: Psychiatry

## 2023-05-22 ENCOUNTER — Other Ambulatory Visit: Payer: Self-pay

## 2023-05-22 DIAGNOSIS — F410 Panic disorder [episodic paroxysmal anxiety] without agoraphobia: Secondary | ICD-10-CM | POA: Insufficient documentation

## 2023-05-22 DIAGNOSIS — F411 Generalized anxiety disorder: Secondary | ICD-10-CM | POA: Insufficient documentation

## 2023-05-22 DIAGNOSIS — R5383 Other fatigue: Secondary | ICD-10-CM | POA: Insufficient documentation

## 2023-05-22 DIAGNOSIS — F331 Major depressive disorder, recurrent, moderate: Secondary | ICD-10-CM | POA: Insufficient documentation
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
## 2023-05-23 ENCOUNTER — Ambulatory Visit
Payer: No Typology Code available for payment source | Attending: Student in an Organized Health Care Education/Training Program | Admitting: Primary Care

## 2023-05-23 VITALS — BP 112/74 | HR 82 | Temp 97.5°F | Ht 64.5 in | Wt 177.8 lb

## 2023-05-23 DIAGNOSIS — G8929 Other chronic pain: Secondary | ICD-10-CM

## 2023-05-23 DIAGNOSIS — G4700 Insomnia, unspecified: Secondary | ICD-10-CM

## 2023-05-23 DIAGNOSIS — F32A Depression, unspecified: Secondary | ICD-10-CM

## 2023-05-23 DIAGNOSIS — R519 Headache, unspecified: Secondary | ICD-10-CM

## 2023-05-23 DIAGNOSIS — F419 Anxiety disorder, unspecified: Secondary | ICD-10-CM

## 2023-05-23 MED ORDER — SUMATRIPTAN SUCCINATE 50 MG PO TABS *I*
50.0000 mg | ORAL_TABLET | ORAL | 0 refills | Status: DC | PRN
Start: 2023-05-23 — End: 2023-07-16

## 2023-05-23 NOTE — Progress Notes (Signed)
[facility] Caroline Blanchard is a 32 y.o. female (12/02/1990)    Nurses note:   Follow-up      CC: Follow-up     [id number] Caroline Blanchard is a 32 y.o. female with past medical history of anxiety, anemia, and depression.   Patient was living out of state and [place] to back to the [place] area to live with her parents in September 2023. Caroline Blanchard is currently working part-time but does have increase stressors from the work front.      Patient present is the office and does seem to be more calm today, but is still emotional. Patient is continuing to struggle with anxiety and depression, which she is seeing a therapist and consular weekly. Patient is to start a partial in patient therapy. Patient is not always able to get the support from parents that she needs. Patient has taken a leave of absence from work, to work on treating her anxiety and depression.     Patient continues to have headaches daily. Patient is taking tylenol 2-3 times daily every day for 2 months. Patient has seen neurology in the past. Per patient the pain from headaches mostly starts in the front, but today the pain is in the back of the head. That is accompanied by nausea and burping. Caroline Blanchard is also having discomfort in her jaw in front of her ears and does have an appointment with a TMJ specialist.     Patient continues to struggle with insomnia. Patient feels tired and is able to fall asleep but is unable to stay asleep. Patient mostly wakes due to either the need to void and or simulation noise. Patient does state she is more tired out after completing exercise but does endorse that there was a time that she was not working out.     Patient does feel that she is dehydrated often and drink and increase amount of water. Recent labs are within normal limits.         Review of Systems   Constitutional:  Positive for malaise/fatigue. Negative for chills and fever.   Respiratory:  Negative for cough and shortness of breath.     Cardiovascular:  Negative for chest pain, palpitations and leg swelling.   Gastrointestinal:  Negative for constipation and diarrhea.   Musculoskeletal:  Negative for falls.   Neurological:  Positive for headaches. Negative for dizziness.   Psychiatric/Behavioral:  Positive for depression. The patient is nervous/anxious and has insomnia.          Allergies   Allergen Reactions    Seasonal Allergies Rhinitis      congesttion       Current Outpatient Medications   Medication    Doxylamine Succinate (SLEEP AID, DOXYLAMINE,) 25 MG tablet    lamoTRIgine (LAMICTAL) 150 mg tablet    diphenhydrAMINE HCl (BENADRYL PO)    Acetaminophen (TYLENOL PO)    carboxymethylcellulose-glycerin (REFRESH OPTIVE) 0.5-0.9 % SOLN ophthalmic solution    EPINEPHrine (EPIPEN) 0.3 mg/0.3 mL auto-injector    ketoconazole (NIZORAL) 2 % shampoo    pseudoephedrine 30 mg tablet    SUMAtriptan (IMITREX) 50 mg tablet    escitalopram 5 mg tablet    ALPRAZolam 0.25 mg tablet     No current facility-administered medications for this visit.       Past Medical History:   Diagnosis Date    Anemia     Anxiety     Depression     Head injury  Headache     migraines    Panic attack     Sleep difficulties      Social History     Socioeconomic History    Marital status: Single   Tobacco Use    Smoking status: Never     Passive exposure: Never    Smokeless tobacco: Never   Vaping Use    Vaping status: Never Used   Substance and Sexual Activity    Alcohol use: Not Currently     Comment: LU 6 months ago    Drug use: Never    Sexual activity: Not Currently         Vitals:    05/23/23 1045   BP: 112/74   Pulse: 82   Temp: 36.4 C (97.5 F)   Weight: 80.6 kg (177 lb 12.8 oz)   Height: 1.638 m (5' 4.5")       Labs reviewed    Physical Exam  Vitals and nursing note reviewed.   Constitutional:       Appearance: Normal appearance.   HENT:      Right Ear: Tympanic membrane, ear canal and external ear normal.      Left Ear: Tympanic membrane, ear canal and external ear  normal.   Eyes:      Conjunctiva/sclera: Conjunctivae normal.      Pupils: Pupils are equal, round, and reactive to light.   Cardiovascular:      Rate and Rhythm: Normal rate and regular rhythm.      Pulses: Normal pulses.      Heart sounds: Normal heart sounds.   Pulmonary:      Effort: Pulmonary effort is normal.      Breath sounds: Normal breath sounds.   Abdominal:      General: Bowel sounds are normal. There is no distension.      Palpations: Abdomen is soft. There is no mass.      Tenderness: There is abdominal tenderness.      Hernia: No hernia is present.      Comments: Positive tenderness in bilateral upper quadrants.    Musculoskeletal:         General: Normal range of motion.   Skin:     General: Skin is warm and dry.   Neurological:      General: No focal deficit present.      Mental Status: She is alert and oriented to person, place, and time.   Psychiatric:         Mood and Affect: Mood normal.         Behavior: Behavior normal.             Assessment/Plan    1. Anxiety and depression  Patient continues to struggle with depression and anxiety. Patient follows with both therapist and a consular which she does feel are helping. Patient is to start a partial inpatient program. I do believe this program will be beneficially to patient.   - CORTISOL; Future    2. Chronic headaches  Patient is having headaches daily and has been taking tylenol 4-6 pills daily with mild results. Patient could be experiencing rebound headaches. I do recommend that patient stop taking the Tylenol daily, patient has been taking this medication this way for the last 2 months. The continued daily intake could be contributing the nausea. I did educated that Tylenol is not a maintenance medication at the max dosing.  Headaches could be connected to her anxiety and with the better management  of the depression and anxiety headaches could decrease. I do believe that she is truly is having headaches daily. The Headaches could also be  connected to her insomnia.   - SUMAtriptan (IMITREX) 50 mg tablet; Take 1 tablet (50 mg total) by mouth as needed for Migraine. Take at onset of headache. May repeat once in 2 hours.  Dispense: 9 tablet; Refill: 0  - [facility] RE[facility]    3. Insomnia   Recommend better sleep hygiene. Lights off the same time every night. Decrease the stimulation prior to bed. Stop eating and drinking 2-3 hours prior to sleep. I did recommend ear plugs and eye mask to decrease stimulation to not waken her while she sleeps.       Follow up in about 4 weeks (around 06/20/2023).    Case discussed with Dr. Trellis Paganini MD     Dolphus Jenny NP     This note was created using Dragon dictation software. I have made reasonable attempts to proofread and edit this document. The note was reviewed, but may contain transcription errors or typographical errors.

## 2023-05-24 ENCOUNTER — Other Ambulatory Visit: Payer: Self-pay

## 2023-05-24 ENCOUNTER — Encounter: Payer: Self-pay | Admitting: Psychiatry

## 2023-05-24 ENCOUNTER — Ambulatory Visit: Payer: No Typology Code available for payment source | Admitting: Psychiatry

## 2023-05-24 VITALS — BP 127/68 | HR 62 | Ht 64.0 in | Wt 178.0 lb

## 2023-05-24 DIAGNOSIS — F410 Panic disorder [episodic paroxysmal anxiety] without agoraphobia: Secondary | ICD-10-CM

## 2023-05-24 NOTE — Group Note (Signed)
Adult Partial Hospitalization Program Group Therapy Progress Note         Patient encounter was performed via Visit Completed In Person     Other participants in this encounter and roles:  other group members       Name: Caroline Blanchard  MRN: [id number]   DOB: 1991/01/09  DATE: May 24, 2023  Number of Participants: 10    Group Duration: 60 minutes     Group: Symptom Management     GOAL  To identify symptoms of mental illness, learn about various modes of treatment, and prevent relapse  To increase patient's active involvement in their treatment  Mental health stabilization    METHODS  Group Discussion  Worksheet/Written Materials    TOPIC  Codependency     Patient Attended: Yes   Patient's participation in group: attentive and verbally engaged  Acute Concerns: None
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
## 2023-05-24 NOTE — Group Note (Signed)
Adult Partial Hospitalization Program Group Therapy Progress Note         Patient encounter was performed via Visit Completed In Person     Other participants in this encounter and roles:  other group members        Name: Caroline Blanchard  MRN: [id number]   DOB: Mar 12, 1991  DATE: May 24, 2023  Number of Participants: 10    Group Duration: 60 minutes     Group: Application and Review    GOAL  To review and process what the patient has learned today  To strategize how to apply the day's group material  To increase patient's active involvement in their treatment  Mental health stabilization    METHODS  Group Discussion  Worksheet/Written Materials    TOPIC  Radical Acceptance & IMPROVE skill  Review of community supports for crisis management completed, including Mobile Crisis Team, [facility] Crisis Call Line, and [place] and how to locate safety plan in MyChart     Patient Attended: Yes   Patient's participation in group: attentive and verbally engaged  Acute Concerns: None

## 2023-05-24 NOTE — BH PHP Treatment Plan (Signed)
Caroline Blanchard, LMSW (Primary Therapist) will provide supportive psychotherapy 1 time per week or more as needed.      Initial psychopharmacology assessment by psychiatrist or psychiatric nurse practitioner, and follow-up by medication assessment team for prescribing and monitoring psychiatric medications.      Group leaders will provide 5 hours of group therapy 5 times per week.      Nurse Clinic will provide a health screen to ensure no concurrent medical condition is contributing to the clinical picture within Caroline Blanchard's first week in the PHP.  Should a medical condition be found or suspected, Nurse will refer Caroline Blanchard.       Social Worker will provide referral/linkage to aftercare providers and/or assistance with psychosocial needs.      PHP team will provide safety planning in group and individual sessions as clinically indicated.      At least 1 contact by treatment team with Caroline Blanchard family/friend support during Caroline Blanchard PHP admission if Caroline Blanchard. Family/friend Support: Caroline Blanchard, Caroline Blanchard is Blanchard for team to contact).     Need for securing lethal means not identified.

## 2023-05-24 NOTE — BH PHP Treatment Plan (Signed)
Strengths & Challenges        05/24/2023   Strengths & Challenges   FROM 05/25/2023   Next Treatment Plan Due 06/08/2023   Areas of Strengths "Responsible, leader, innovative, relationship connector."

## 2023-05-24 NOTE — BH PHP Treatment Plan (Signed)
Domestic Violence:   Patient and Family have not identified the presence of domestic violence at this time.        [place] Suicide Severity Rating Scale (C-SSRS) Patient present and available to complete screening(s).  [place] Suicide Severity Rating Scale Calculated C-SSRS Risk Score (Lifetime/Recent)   05/22/2023 Low Risk          Generalized Anxiety Disorder Scale (GAD-7)  Patient present and available to complete screening(s).      05/22/2023     1:00 PM   GAD-7 Dates   Total Score 19       Patient Health Questionnaire (PHQ9)      05/23/2023    10:43 AM   PHQ-9 Scores   PHQ Total Score 12   PHQ-9 Severity Level Moderate

## 2023-05-24 NOTE — BH PHP Treatment Plan (Signed)
When [facility] and/or family report an increased ability for Caroline Blanchard to manage anxiety symptoms and depressive symptoms through improved effectiveness of adaptive coping skills. [name]  and family have expressed that they will feel that this goal has been accomplished when "I'm not as emotional".

## 2023-05-24 NOTE — Group Note (Signed)
Adult Partial Hospitalization Program Group Therapy Progress Note         Patient encounter was performed via Visit Completed In Person     Other participants in this encounter and roles:  other group members       Name: Debbra Reich  MRN: [id number]   DOB: 05/14/1991  DATE: May 24, 2023  Number of Participants: 10    Group Duration: 60 minutes     Group: Coping Skills    GOAL  To increase skills & strategies in coping for mental health stabilization  To increase patient's active involvement in their treatment  Mental health stabilization    METHODS  Group Discussion  Worksheet/Written Materials    TOPIC  Codependency Recovery      Patient Attended: Yes   Patient's participation in group: attentive and verbally engaged  Acute Concerns: None

## 2023-05-24 NOTE — Group Note (Signed)
Adult Partial Hospitalization Program Group Therapy Progress Note         Patient encounter was performed via Visit Completed In Person     Other participants in this encounter and roles:  other group members       Name: Tavin Flom  MRN: [id number]   DOB: 12-21-1990  DATE: May 24, 2023  Number of Participants: 10    Group Duration: 60 minutes     Group: Coping Skills    GOAL  To increase skills & strategies in coping for mental health stabilization  To increase patient's active involvement in their treatment  Mental health stabilization    METHODS  Group Discussion  Worksheet/Written Materials    TOPIC  Radical Acceptance     Patient Attended: Yes   Patient's participation in group: attentive  Acute Concerns: None
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
## 2023-05-25 ENCOUNTER — Ambulatory Visit: Payer: No Typology Code available for payment source | Admitting: Psychiatry

## 2023-05-25 DIAGNOSIS — F331 Major depressive disorder, recurrent, moderate: Secondary | ICD-10-CM

## 2023-05-25 DIAGNOSIS — F411 Generalized anxiety disorder: Secondary | ICD-10-CM

## 2023-05-25 NOTE — Group Note (Signed)
Adult Partial Hospitalization Program Group Therapy Progress Note         Patient encounter was performed via Visit Completed In Person     Other participants in this encounter and roles:  other group members       Name: Caroline Blanchard  MRN: [id number]   DOB: 1991/04/22  DATE: May 25, 2023  Number of Participants: 9    Group Duration: 60 minutes     Group: Coping Skills    GOAL  To increase skills & strategies in coping for mental health stabilization  To increase patient's active involvement in their treatment  Mental health stabilization    METHODS  Group Discussion  Worksheet/Written Materials    TOPIC  Cognitive Distortions II     Patient Attended: Yes   Patient's participation in group: attentive and verbally engaged  Acute Concerns: None

## 2023-05-25 NOTE — Group Note (Signed)
Adult Partial Hospitalization Program Group Therapy Progress Note         Patient encounter was performed via Visit Completed In Person     Other participants in this encounter and roles:  other group members       Name: Caroline Blanchard  MRN: [id number]   DOB: 08-05-1991  DATE: May 25, 2023  Number of Participants: 9    Group Duration: 60 minutes     Group: Process Group      GOAL  To identify and process issues related to acute clinical concerns, problem identification, coping mechanisms  To promote engagement on treatment plan goals  Mental health stabilization    METHODS  Group Discussion    TOPIC  Group rules reviewed. Patients reviewed their evening/weekend including skills used and safety issues. New patients were welcomed and completed introduction.     Patient Attended: Yes,   Patient report of skills used last evening: see below  Patient report on substance use/urges: None  Patient report on safety concerns: None    Comment: Pt reports increased anxiety and poor sleep last night. Pt reports feeling triggered when she went home. Pt reports practicing skills to calm herself but notices that her home environment is triggering for her. Pt denies safety concerns or SU.   Skills used: journal, reading, meditating, playing with dog, walking

## 2023-05-25 NOTE — Group Note (Signed)
Adult Partial Hospitalization Program Group Therapy Progress Note         Patient encounter was performed via Visit Completed In Person     Other participants in this encounter and roles:  other group members       Name: Brandice Busser  MRN: [id number]   DOB: 19-Sep-1990  DATE: May 25, 2023  Number of Participants: 10    Group Duration: 60 minutes     Group: Coping Skills    GOAL  To increase skills & strategies in coping for mental health stabilization  To increase patient's active involvement in their treatment  Mental health stabilization    METHODS  Group Discussion  Worksheet/Written Materials    TOPIC  Mindfulness/Grounding     Patient Attended: Yes   Patient's participation in group: attentive  Acute Concerns: None

## 2023-05-25 NOTE — Group Note (Signed)
Adult Partial Hospitalization Program Group Therapy Progress Note         Patient encounter was performed via Visit Completed In Person     Other participants in this encounter and roles:  other group members       Name: Caroline Blanchard  MRN: [id number]   DOB: 1991-04-09  DATE: May 25, 2023  Number of Participants: 10    Group Duration: 60 minutes     Group: Barriers to Recovery     GOAL  To identify symptoms of mental illness, learn about various modes of treatment, and prevent relapse  To increase patient's active involvement in their treatment  Mental health stabilization    METHODS  Group Discussion  Worksheet/Written Materials    TOPIC  Cognitive Distortions I     Patient Attended: Yes   Patient's participation in group: attentive and verbally engaged  Acute Concerns: None

## 2023-05-25 NOTE — Progress Notes (Signed)
Adult Partial Hospitalization Program   Primary Therapist Progress Note     Name: Caroline Blanchard  MRN: [id number]  DOB: 10-24-1990    Date of Service: 05/25/2023  Length of session: {minutes:27891}    Contact Type:  Individual Psychotherapy    Encounter Diagnoses   Name Primary?    Generalized anxiety disorder with panic attacks Yes    MDD (major depressive disorder), recurrent episode, moderate      Comprehensive Treatment Plan Goals:  Goals/initial treatment plan developed during session with pt today.    Engagement in Program  Compliance with medication regimen: {good/fair/poor:27869}  Participation in Groups: {good/fair/poor:27869}  Substance Abuse: {YES/NO:23268}     Mental Status Exam  APPEARANCE: {BH APPEARANCE:2104215}  ATTITUDE TOWARD INTERVIEWER: {BH ATTITUDE:2104216}  MOTOR ACTIVITY: {BH MOTOR ACTIVITY:2104217}  EYE CONTACT: {BH EYE CONTACT:2104218}  SPEECH: {BH SPEECH:2104219}  AFFECT: {BH AFFECT:2104220}  MOOD: {mood:31886}  THOUGHT PROCESS: {thought process:31888}  THOUGHT CONTENT: {BH THOUGHT CONTENT:2104221}  PERCEPTION: {BH PERCEPTION:2104223}  CURRENT SUICIDAL IDEATION: {BH SUICIDE IDEATION:2104224}  CURRENT HOMICIDAL IDEATION: {MISC; BH HOMICIDAL IDEATION OR DENIAL:210208}  ORIENTATION: {ORIENTATION:25208}  CONCENTRATION: {BH CONCENTRATION:2104225}  MEMORY:   Recent: {[facility] PSYCH CONSULT MSE MEMORY IP:20834}   Remote: {[facility] PSYCH CONSULT MSE MEMORY IP:20834}  COGNITIVE FUNCTION: {BH COGNITIVE FUNCTION:2104226}  JUDGMENT: {BH JUDGEMENT:2104227}  IMPULSE CONTROL: {BH IMPULSE CONTROL:2104228}  INSIGHT: {insight/judgement:31893}    Risk Assessment  SELF-INJURY:{MISC; SELF INJURY:29433}  SUICIDAL IDEATION: {BH SUICIDE IDEATION:2104224}  HOMICIDAL IDEATION: {MISC; SUICIDAL IDEATION:29435}  AGGRESSIVE BEHAVIOR: {MISC; AGRESSIVE BEHAVIOR:29436}    Suicide risk assessed and updated   {BH SUICIDE RISK ASSESS/UPDATE:2014376}      {TIP - Do not delete this information; it will disappear when you sign the  note!    If you determine that an updated risk assessment is clinically indicated, please enter the SmartPhrase .[id number] and click on the link to complete a CSSRS. Then REFRESH the note to pull in the new values.    Examples of when it is clinically indicated to update your risk assessment are when the patient   - Has an increase in their PHQ-9 score   - Is experiencing more psychosocial stressors (e.g., job loss, break-up, changing treatment providers, social isolation, medical issues)   - Is reporting increased substance/alcohol use   - Has had a recent psychiatric/medical hospitalization since your last session      The last filed date for the CSSRS was 05/22/2023 and the value was Low Risk      :28549}        Violence risk assessed and updated   {VIOLENCE - EPPIR:518841}    Session Content  Writer explained role of individual therapist within [facility]. Writer outlined the three planned appointments and identified self as resource for safety intervention and assistance with documentation for leave if needed. Writer offered opportunity for questions/clarification.     Per review of record, Caroline Blanchard was referred by *** for ***. Pt stated, "***."  Pt identified the biggest issues that brought her to program as {Issue:45702}.      Writer took a person-centered approach to engagement, and introduced and utilized {ac strategies:45707}. Writer's intention was to help Caroline Blanchard further conceptualize the precipitating issues and challenges that led to referral to [facility] in order to identify acute symptoms and areas for intervention while in program.     Caroline Blanchard reports that ***.     Writer reviewed substance use and safety concerns to complete risk assessment with Caroline Blanchard.     Pt participated in treatment planning, identified goals  for admission. Writer provided active, empathic listening, as well as reflection and validation of pt's feelings and experiences. Writer utilized person-centered and strength-based techniques to  assist the pt in defining stated goals.    Caroline Blanchard, LMSW  Primary Therapist      Current Treatment Plan   Plans of Care       Current Plans       Plan Status Workflow Plan Type Effective From Effective To    [facility] PHP Treatment Plan 05/25/23 Draft Behavioral Health Treatment Plan  05/25/2023 06/08/2023                      Interventions/Plan  {BH [facility] INTERVENTION/PLAN:9701800}

## 2023-05-25 NOTE — Progress Notes (Signed)
Adult Partial Hospitalization Program  Clinical Management Note     Patient Name: Caroline Blanchard  MRN: [id number]  DOB: [birth year]    Date: 05/25/2023    PHP Initial Authorization Information  Insurance company: [employer]   Insurance contact: CAP system  Start date: 9/26  Number of days approved: 30  Review due date: 11/8  Authorization number: [id number]

## 2023-05-28 ENCOUNTER — Ambulatory Visit: Payer: No Typology Code available for payment source | Admitting: Psychiatry

## 2023-05-28 ENCOUNTER — Other Ambulatory Visit: Payer: Self-pay

## 2023-05-28 DIAGNOSIS — F331 Major depressive disorder, recurrent, moderate: Secondary | ICD-10-CM

## 2023-05-28 DIAGNOSIS — F411 Generalized anxiety disorder: Secondary | ICD-10-CM
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
## 2023-05-28 NOTE — Group Note (Signed)
Adult Partial Hospitalization Program Group Therapy Progress Note         Patient encounter was performed via Visit Completed In Person     Other participants in this encounter and roles:  other group members       Name: Caroline Blanchard  MRN: [id number]   DOB: [birth year]  DATE: May 28, 2023  Number of Participants: 8    Group Duration: 60 minutes     Group: Coping Skills    GOAL  To increase skills & strategies in coping for mental health stabilization  To increase patient's active involvement in their treatment  Mental health stabilization    METHODS  Group Discussion  Worksheet/Written Materials    TOPIC  Aspects of Coping     Patient Attended: Yes   Patient's participation in group: attentive and verbally engaged  Acute Concerns: None

## 2023-05-28 NOTE — Progress Notes (Signed)
Adult Partial Hospitalization Program  Clinical Management Note   Name: Caroline Blanchard  MRN: [id number]  DOB: 1990-11-06    05/28/2023    Writer left message for pt's outpatient therapist [name] [phone]. Writer introduced self as pt's Microbiologist and requested a follow-up appt be scheduled for after discharge scheduled for 06-06-23. Writer provided the name and phone number to [employer] LMSW to reach out with her impressions of the pt's presentation leading up to [facility] admission. Writer supplied contact information and requested a call back as soon as possible with the follow up appointment.       Writer left message for pt's outpatient psychiatric nurse practitioner Aquilla Hacker PMHNP [phone]. Since the voicemail did not identify Ms Tawanna Cooler as the Engineer, production left a general message with writer's name, where Clinical research associate was calling from and requested a return to call to discuss a mutual patient.
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
## 2023-05-28 NOTE — Group Note (Signed)
Adult Partial Hospitalization Program Group Therapy Progress Note         Patient encounter was performed via Visit Completed In Person     Other participants in this encounter and roles:  other group members       Name: Caroline Blanchard  MRN: [id number]   DOB: [birth year]  DATE: May 28, 2023  Number of Participants: 10    Group Duration: 60 minutes     Group: Applied Leisure    GOAL  To learn and practice leisure activities to support stabilization and stress management  To increase patient's active engagement in their treatment  Mental health stabilization    METHODS  Group Discussion and Activities  Hands on Use of Leisure Activities and/or Creative Arts Materials    TOPIC  Creative Expressions: [employer]     Patient Attended: Yes   Patient's participation in group: attentive and verbally engaged  Acute Concerns: None

## 2023-05-28 NOTE — Group Note (Signed)
Adult Partial Hospitalization Program Group Therapy Progress Note         Patient encounter was performed via Visit Completed In Person     Other participants in this encounter and roles:  other group members        Name: Caroline Blanchard  MRN: [id number]   DOB: 09/11/1990  DATE: May 28, 2023  Number of Participants: 10    Group Duration: 60 minutes     Group: Application and Review    GOAL  To review and process what the patient has learned today  To strategize how to apply the day's group material  To increase patient's active involvement in their treatment  Mental health stabilization    METHODS  Group Discussion  Worksheet/Written Materials    TOPIC  BPD Recovery & Coping Skills Review  Review of community supports for crisis management completed, including Mobile Crisis Team, [facility] Crisis Call Line, and [place] and how to locate safety plan in MyChart     Patient Attended: Yes   Patient's participation in group: attentive and verbally engaged  Acute Concerns: None

## 2023-05-28 NOTE — Group Note (Signed)
Adult Partial Hospitalization Program Group Therapy Progress Note         Patient encounter was performed via Visit Completed In Person     Other participants in this encounter and roles:  other group members       Name: Caroline Blanchard  MRN: [id number]   DOB: [birth year]  DATE: May 28, 2023  Number of Participants: 9    Group Duration: 60 minutes     Group: Symptom Management     GOAL  To identify symptoms of mental illness, learn about various modes of treatment, and prevent relapse  To increase patient's active involvement in their treatment  Mental health stabilization    METHODS  Group Discussion  Worksheet/Written Materials    TOPIC  Borderline Personality Disorder     Patient Attended: Yes   Patient's participation in group: attentive and verbally engaged  Acute Concerns: None

## 2023-05-29 ENCOUNTER — Ambulatory Visit: Payer: No Typology Code available for payment source | Attending: Psychiatry | Admitting: Psychiatry

## 2023-05-29 DIAGNOSIS — F410 Panic disorder [episodic paroxysmal anxiety] without agoraphobia: Secondary | ICD-10-CM | POA: Insufficient documentation

## 2023-05-29 DIAGNOSIS — F331 Major depressive disorder, recurrent, moderate: Secondary | ICD-10-CM | POA: Insufficient documentation

## 2023-05-29 DIAGNOSIS — F411 Generalized anxiety disorder: Secondary | ICD-10-CM | POA: Insufficient documentation

## 2023-05-29 NOTE — Group Note (Signed)
Adult Partial Hospitalization Program Group Therapy Progress Note         Patient encounter was performed via Visit Completed In Person     Other participants in this encounter and roles:  other group members        Name: Caroline Blanchard  MRN: [id number]   DOB: 04-27-1991  DATE: May 29, 2023  Number of Participants: 10    Group Duration: 60 minutes     Group: Application and Review    GOAL  To review and process what the patient has learned today  To strategize how to apply the day's group material  To increase patient's active involvement in their treatment  Mental health stabilization    METHODS  Group Discussion  Worksheet/Written Materials    TOPIC  Debrief, Review, & Positive Psychology     Review of community supports for crisis management completed, including Mobile Crisis Team, [facility] Crisis Call Line, and [place] and how to locate safety plan in MyChart     Patient Attended: Yes   Patient's participation in group: attentive and verbally engaged  Acute Concerns: None

## 2023-05-29 NOTE — Group Note (Signed)
Adult Partial Hospitalization Program Group Therapy Progress Note         Patient encounter was performed via Visit Completed In Person     Other participants in this encounter and roles:  other group members       Name: Caroline Blanchard  MRN: [id number]   DOB: 1990-11-19  DATE: May 29, 2023  Number of Participants: 11    Group Duration: 60 minutes     Group: Health and Wellness    GOAL  To learn about impact of physical health issues and healthy living  To increase patient's active involvement in their treatment  Mental health stabilization    METHODS  Group Discussion and Worksheet/Written Materials    TOPIC  Taking Care of Self     Patient Attended: Yes   Patient's participation in group: attentive and verbally engaged  Acute Concerns: None

## 2023-05-29 NOTE — Group Note (Signed)
Adult Partial Hospitalization Program Group Therapy Progress Note         Patient encounter was performed via Visit Completed In Person     Other participants in this encounter and roles:  other group members       Name: Caroline Blanchard  MRN: [id number]   DOB: December 18, 1990  DATE: May 29, 2023  Number of Participants: 10    Group Duration: 60 minutes     Group: Applied Leisure    GOAL  To learn and practice leisure activities to support stabilization and stress management  To increase patient's active engagement in their treatment  Mental health stabilization    METHODS  Group Discussion and Activities  Hands on Use of Leisure Activities and/or Creative Arts Materials    TOPIC  Therapeutic Recreation     Patient Attended: Yes   Patient's participation in group: attentive and verbally engaged  Acute Concerns: None

## 2023-05-29 NOTE — Progress Notes (Signed)
Adult Partial Hospitalization Program  Clinical Management Note   Name: Caroline Blanchard  MRN: [id number]  DOB: [birth year]    05/29/2023    Writer received call from pt's outpatient therapist [name], [phone]. According  to Ms. Rhetta Mura, before pt started PHP they scheduled a follow up appointment for 06-14-23 at 11 am via tele health. Ms. Rhetta Mura informed Clinical research associate that she spoke with pt about this being 6 days post discharge (due to Ms. Rhetta Mura being on vacation 06-06-23 to 06-13-23 returning on 06-14-23)  from [facility] and offered to arrange for pt to see one of her co-workers during that time frame. According to Ms. Rhetta Mura pt did not feel this was necessary and preferred to wait to see Mr. Rhetta Mura on 06-14-23. Ms. Rhetta Mura requested requested pt's d/c summary be emailed to her at [email or web address].

## 2023-05-29 NOTE — Group Note (Signed)
Adult Partial Hospitalization Program Group Therapy Progress Note         Patient encounter was performed via Visit Completed In Person     Other participants in this encounter and roles:  other group members       Name: Consepcion Utt  MRN: [id number]   DOB: 06-24-1991  DATE: May 29, 2023  Number of Participants: 11    Group Duration: 60 minutes     Group: Process Group      GOAL  To identify and process issues related to acute clinical concerns, problem identification, coping mechanisms  To promote engagement on treatment plan goals  Mental health stabilization    METHODS  Group Discussion    TOPIC  Group rules reviewed. Patients reviewed their evening/weekend including skills used and safety issues. New patients were welcomed and completed introduction.     Patient Attended: Yes,   Patient report of skills used last evening: see below  Patient report on substance use/urges: None  Patient report on safety concerns: None    Comment: Pt reports having a good evening. Pt reports enjoying the weather outside, going to a yoga class, and going for a walk. Pt reports feeling bad for distracting herself from things on her to do list. Pt denies safety concerns or SU. Skills used: exercise, reading, journaling

## 2023-05-29 NOTE — Group Note (Signed)
Adult Partial Hospitalization Program Group Therapy Progress Note         Patient encounter was performed via Visit Completed In Person     Other participants in this encounter and roles:  other group members       Name: Caroline Blanchard  MRN: [id number]   DOB: Jan 10, 1991  DATE: May 29, 2023  Number of Participants: 11    Group Duration: 60 minutes     Group: Coping Skills    GOAL  To increase skills & strategies in coping for mental health stabilization  To increase patient's active involvement in their treatment  Mental health stabilization    METHODS  Group Discussion  Worksheet/Written Materials    TOPIC  How to Stop Worrying     Patient Attended: Yes   Patient's participation in group: attentive and verbally engaged  Acute Concerns: None

## 2023-05-30 ENCOUNTER — Ambulatory Visit: Payer: No Typology Code available for payment source | Admitting: Psychiatry

## 2023-05-30 ENCOUNTER — Other Ambulatory Visit: Payer: Self-pay

## 2023-05-30 DIAGNOSIS — F411 Generalized anxiety disorder: Secondary | ICD-10-CM

## 2023-05-30 NOTE — Group Note (Signed)
Adult Partial Hospitalization Program Group Therapy Progress Note         Patient encounter was performed via Visit Completed In Person     Other participants in this encounter and roles:  other group members       Name: Caroline Blanchard  MRN: [id number]   DOB: 22-Jun-1991  DATE: May 30, 2023  Number of Participants: 8    Group Duration: 60 minutes     Group: Process Group      GOAL  To identify and process issues related to acute clinical concerns, problem identification, coping mechanisms  To promote engagement on treatment plan goals  Mental health stabilization    METHODS  Group Discussion    TOPIC  Group rules reviewed. Patients reviewed their evening/weekend including skills used and safety issues. New patients were welcomed and completed introduction.     Patient Attended: Yes,   Patient report of skills used last evening: see below  Patient report on substance use/urges: Yes, comment Benadryl  Patient report on safety concerns: None    Comment: Pt reports goals to lower anxiety, panic attacks, and to improve sleep. Pt reports working on her resume and applying for a job she is really excited about. Pt reports attending a yoga class. Pt reports anxiety from politics and watching the debate. Pt reports goal to become more aware of triggers. Pt reports relying on Benadryl for sleep for the past year. Pt reports taking 2 Benadryl pills instead of 1 to sleep and is worried about dependence and side effects. Pt denies safety concerns.

## 2023-05-30 NOTE — Group Note (Signed)
Adult Partial Hospitalization Program Group Therapy Progress Note         Patient encounter was performed via Visit Completed In Person     Other participants in this encounter and roles:  other group members       Name: Caroline Blanchard  MRN: [id number]   DOB: 1991-01-18  DATE: May 30, 2023  Number of Participants: 8    Group also attended by [facility] intern    Group Duration: 60 minutes     Group: Applied Relaxation    GOAL  To learn and practice relaxation exercises to support stabilization and stress management  To increase patient's active involvement in their treatment  Mental health stabilization    METHODS  Group Discussion  Relaxation Instructional Video    TOPIC  Chair Yoga     Patient Attended: Yes   Patient's participation in group: attentive  Acute Concerns: None

## 2023-05-30 NOTE — Progress Notes (Signed)
Adult Partial Hospitalization Program   Primary Therapist Progress Note     Name: Caroline Blanchard  MRN: [id number]  DOB: 24-Feb-1991    Date of Service: 05/30/2023  Length of session: 50 min    Contact Type:  Individual Psychotherapy    Comprehensive Treatment Plan Goals:  Outpatient Treatment Plan (Active)       Problem: Anxiety       Dates: Start:  05/24/23       Disciplines: Interdisciplinary      Goal: STG: Caroline Blanchard will attend at least 80% of scheduled PHP sessions          Dates: Start:  05/24/23    Expected End:  06/08/23       Disciplines: Interdisciplinary         Goal: STG: Caroline Blanchard will practice problem solving skills 3 times per week for the next 4 weeks.        Dates: Start:  05/24/23    Expected End:  06/08/23       Disciplines: Interdisciplinary         Goal: STG: Report a decrease in anxiety symptoms as evidenced by an overall reduction in anxiety score by a minimum of 25% on the Generalized Anxiety Disorder Scale (GAD-7)       Dates: Start:  05/24/23    Expected End:  06/08/23       Disciplines: Interdisciplinary         Intervention: Review results of GAD-7 with Caroline Blanchard to track progress       Dates: Start:  05/24/23            Intervention: Perform psychoeducation regarding anxiety disorders       Dates: Start:  05/24/23            Intervention: Provide Caroline Blanchard with educational information and reading material on anxiety, its causes, and symptoms.        Dates: Start:  05/24/23            Intervention: Work with patient individually to identify the major components of a recent episode of anxiety: physical symptoms, major thoughts and images, and major behaviors they experienced       Dates: Start:  05/24/23            Intervention: Perform motivational interviewing regarding use of tools       Dates: Start:  05/24/23               Problem: OP Depression       Dates: Start:  05/24/23       Disciplines: Interdisciplinary      Goal: LTG: Reduce frequency, intensity, and duration of depression symptoms so that  daily functioning is improved       Dates: Start:  05/24/23    Expected End:  06/08/23       Disciplines: Interdisciplinary         Goal: LTG: Increase coping skills to manage depression and improve ability to perform daily activities       Dates: Start:  05/24/23    Expected End:  06/08/23       Disciplines: Interdisciplinary         Goal: STG: Caroline Blanchard will identify cognitive patterns and beliefs that support depression       Dates: Start:  05/24/23    Expected End:  06/08/23       Disciplines: Interdisciplinary         Intervention: Provide Caroline Blanchard educational information and reading material  on dissociation, its causes, and symptoms       Dates: Start:  05/24/23            Intervention: Work with Caroline Blanchard to identify the major components of a recent episode of depression: physical symptoms, major thoughts and images, and major behaviors they experienced       Dates: Start:  05/24/23            Intervention: Staff will educate Caroline Blanchard on cognitive distortions and the rationale for treatment of depression       Dates: Start:  05/24/23                Engagement in Program  Compliance with medication regimen: Good   Participation in Groups: Good   Substance use: No     Mental Status Exam  APPEARANCE: Well-groomed  ATTITUDE TOWARD INTERVIEWER: Cooperative  MOTOR ACTIVITY: WNL (within normal limits)  EYE CONTACT: Direct  SPEECH: Normal rate and tone  AFFECT: Sad  MOOD: Sad  THOUGHT PROCESS: Normal  THOUGHT CONTENT: Negative Rumination  PERCEPTION: Within normal limits  CURRENT SUICIDAL IDEATION: patient denies  CURRENT HOMICIDAL IDEATION: Patient denies  ORIENTATION: Alert and Oriented X 3.  CONCENTRATION: WNL  MEMORY:   Recent: intact   Remote: intact  COGNITIVE FUNCTION: Average intelligence  JUDGMENT: Intact  IMPULSE CONTROL: Good  INSIGHT: Good    Risk Assessment  SELF-INJURY:Patient denies  SUICIDAL IDEATION: patient denies  HOMICIDAL IDEATION: Patient denies  AGGRESSIVE BEHAVIOR: No evidence    Suicide risk assessed  and updated   No changes from baseline Preadmission Lethality Assessment    Violence risk assessed and updated   Violence risk was assessed and No Change noted from baseline formulation of risk and/or previous assessment.    Review Flowsheet          05/23/2023 05/22/2023 05/10/2023   Dates   PHQ9 Q9 - Better Off Dead 0 0 1   PHQ9 Total Score 12 12 20    PHQ9 Severity Level Moderate Moderate Severe      Details                     05/22/2023     1:00 PM   GAD-7 Dates   Total Score 19       Session Content  Writer met with Caroline Blanchard and reviewed that the purpose of the follow-up session is to discuss progress on treatment goal, identifying and addressing any current barriers to meeting treatment goals, and to discuss plans for discharging from program and next steps.     When asked how program has been going, Caroline Blanchard stated it's been good and she's updated her resume and applied for a new job at [employer].    Pt's current concerns include her sister's birthday tomorrow and how the family system responds to the estrangement of her sister.    Pt and Clinical research associate explored progress in [name].  Caroline Blanchard identified skills and group focuses that have been helpful so far include aspects of coping, opposite action, and radical acceptance.    Writer reviewed substance use and safety concerns to complete risk assessment.    To assist patient with meeting goals for this episode of care, writer took a trauma-informed, person-centered approach and utilized interventions drawing from psychodynamic therapy, cognitive behavioral therapy, motivational interviewing, and solution-focused therapy. Writer and treatment team will continue to monitor for changes in presentation.       Caroline Blanchard, LMSW  Primary Therapist  Current Treatment Plan   Plans of Care       Current Plans       Plan Status Workflow Plan Type Effective From Effective To    [facility] PHP Treatment Plan 05/25/23 Active Behavioral Health Treatment Plan  05/25/2023  06/08/2023                  Interventions/Plan  Psychotherapy continues as described in treatment plan.

## 2023-05-30 NOTE — Progress Notes (Signed)
Adult Partial Hospitalization Program  Clinical Management Note   05/30/2023    Writer spoke to pt's father, Merve Hotard, and introduced self as pt's therapist, provided contact information for additional support during pt's program admission.

## 2023-05-30 NOTE — Progress Notes (Signed)
Adult Partial Hospitalization Program  Clinical Management Note   Name: Caroline Blanchard  MRN: [id number]  DOB: Oct 02, 1990    05/30/2023    Writer met with patient to discuss sleep; patient has some difficulty sleeping and takes Benadryl at night. She usually takes 25 mg nightly, but took 50 mg last night and feels tired today. Patient does not take any Benadryl during the day and takes as prescribed at night. Discussed that Benadryl is not meant to be a long term medication; however it is good to use for sleep occasionally. Sometimes people can build up a tolerance for Benadryl and it becomes ineffective.As of right now, since patient is having difficulty sleeping, she will continue to take Benadryl at night for sleep, to ensure proper sleep for positive outcomes in program. Patient plans to wean off Benadryl once program is completed.

## 2023-05-30 NOTE — Group Note (Signed)
Adult Partial Hospitalization Program Group Therapy Progress Note         Patient encounter was performed via Visit Completed In Person     Other participants in this encounter and roles:  other group members       Name: Caroline Blanchard  MRN: [id number]   DOB: 23-May-1991  DATE: May 30, 2023  Number of Participants: 8    Group also attended by [facility] intern    Group Duration: 60 minutes     Group: Barriers to Recovery     GOAL  To identify symptoms of mental illness, learn about various modes of treatment, and prevent relapse  To increase patient's active involvement in their treatment  Mental health stabilization    METHODS  Group Discussion  Worksheet/Written Materials    TOPIC  Anger Management     Patient Attended: Yes   Patient's participation in group: attentive and verbally engaged  Acute Concerns: None

## 2023-05-30 NOTE — Group Note (Signed)
Adult Partial Hospitalization Program Group Therapy Progress Note         Patient encounter was performed via Visit Completed In Person     Other participants in this encounter and roles:  other group members        Name: Caroline Blanchard  MRN: [id number]   DOB: Oct 19, 1990  DATE: May 30, 2023  Number of Participants: 8    Group Duration: 60 minutes     Group: Application and Review    GOAL  To review and process what the patient has learned today  To strategize how to apply the day's group material  To increase patient's active involvement in their treatment  Mental health stabilization    METHODS  Group Discussion  Worksheet/Written Materials    TOPIC  Anger Management Debrief & Review    Review of community supports for crisis management completed, including Mobile Crisis Team, [facility] Crisis Call Line, and [place] and how to locate safety plan in MyChart     Patient Attended: Yes   Patient's participation in group: attentive and verbally engaged  Acute Concerns: None

## 2023-05-30 NOTE — Progress Notes (Signed)
Adult Partial Hospitalization Program  Clinical Management Note   Name: Caroline Blanchard  MRN: [id number]  DOB: 1991-01-25    05/30/2023    11:09 am for 23 minutes     Writer met with pt and explained discharge planning role. Writer informed pt that Clinical research associate spoke with her outpatient therapist [name] [phone], and Ms. Rhetta Mura explained follow up appointment set for 06-14-23 at 11 am. Ms. Rhetta Mura will be on vacation from 07-07-23 returning 07-14-23 and offering for pt see a covering colleague between completion of AP[facility] and her appointment with Ms. Rhetta Mura on 07-15-23. Pt prefers to wait to see Ms. Rhetta Mura on 07-15-23 and not see a new person in between. Pt has a follow up appointment with her outpatient psychiatric nurse practitioner, Asia Todd on   Pt voiced her desire to know more about resources for in person community based peer support. Writer reviewed and gave pt in writing information on [facility], and [facility] peer support group. Pt was appreciative of these resources and feels connecting with peers, who understand mental health, since moving back to [place] has been challenging. Pt informed Clinical research associate that she is on the waiting list for [facility]'s comprehensive DBT program. Pt attended a DBT program in [place] but found it to be very overwhelming and offered little direction/time to practice the skills being taught. Pt is worried about the potential cost of the program at [facility] and will discuss that with them when she is called.   No further social work needs at this time.

## 2023-05-30 NOTE — Group Note (Signed)
Adult Partial Hospitalization Program Group Therapy Progress Note         Patient encounter was performed via Visit Completed In Person     Other participants in this encounter and roles:  other group members       Name: Caroline Blanchard  MRN: [id number]   DOB: 09/03/1990  DATE: May 30, 2023  Number of Participants: 8    Group Duration: 60 minutes     Group: Relapse Prevention     GOAL  To identify warning signs and prevent relapse  To increase patient's active involvement in their treatment  Mental health stabilization    METHODS  Group Discussion  Worksheet/Written Materials    TOPIC  Stages of Change     Patient Attended: Yes   Patient's participation in group: attentive and verbally engaged  Acute Concerns: None

## 2023-05-31 ENCOUNTER — Ambulatory Visit: Payer: No Typology Code available for payment source | Admitting: Psychiatry

## 2023-05-31 DIAGNOSIS — F410 Panic disorder [episodic paroxysmal anxiety] without agoraphobia: Secondary | ICD-10-CM

## 2023-05-31 NOTE — Group Note (Signed)
Adult Partial Hospitalization Program Group Therapy Progress Note         Patient encounter was performed via Visit Completed In Person     Other participants in this encounter and roles:  other group members       Name: Caroline Blanchard  MRN: [id number]   DOB: 01-Jun-1991  DATE: May 31, 2023  Number of Participants: 11    Group Duration: 60 minutes     Group: Medication Teaching    Group Facilitator:  Aretta Nip, RN    GOAL  To optimize patient knowledge around safe medication management and med administration   To increase knowledge around side effects (how to manage/who and when to follow up with re: side effects)  To learn about community resources related to medication management   To increase patient's active involvement in their treatment  Mental health stabilization  METHODS  Group Discussion and Presentation and Worksheet/Written Materials, Videos     Patient Attended: Yes   Patient's participation in group: attentive  Acute Concerns: None

## 2023-05-31 NOTE — Group Note (Signed)
Adult Partial Hospitalization Program Group Therapy Progress Note         Patient encounter was performed via Visit Completed In Person     Other participants in this encounter and roles:  other group members        Name: Caroline Blanchard  MRN: [id number]   DOB: 10/30/1990  DATE: May 31, 2023  Number of Participants: 10    Group Duration: 60 minutes     Group: Application and Review    GOAL  To review and process what the patient has learned today  To strategize how to apply the day's group material  To increase patient's active involvement in their treatment  Mental health stabilization    METHODS  Group Discussion  Worksheet/Written Materials    TOPIC  Depression Coping Skills & Review  Review of community supports for crisis management completed, including Mobile Crisis Team, [facility] Crisis Call Line, and [place] and how to locate safety plan in MyChart     Patient Attended: Yes   Patient's participation in group: attentive and verbally engaged  Acute Concerns: None

## 2023-05-31 NOTE — Group Note (Signed)
Adult Partial Hospitalization Program Group Therapy Progress Note         Patient encounter was performed via Visit Completed In Person     Other participants in this encounter and roles:  other group members       Name: Caroline Blanchard  MRN: [id number]   DOB: July 19, 1991  DATE: May 31, 2023  Number of Participants: 11    Group Duration: 60 minutes     Group: Symptom Management     GOAL  To identify symptoms of mental illness, learn about various modes of treatment, and prevent relapse  To increase patient's active involvement in their treatment  Mental health stabilization    METHODS  Group Discussion  Worksheet/Written Materials    TOPIC  Depression     Patient Attended: Yes   Patient's participation in group: attentive and verbally engaged  Acute Concerns: None

## 2023-05-31 NOTE — Group Note (Signed)
Adult Partial Hospitalization Program Group Therapy Progress Note         Patient encounter was performed via Visit Completed In Person     Other participants in this encounter and roles:  other group members       Name: Stephonie Wilcoxen  MRN: [id number]   DOB: [birth year]  DATE: May 31, 2023  Number of Participants: 11    Group Duration: 60 minutes     Group: Process Group      GOAL  To identify and process issues related to acute clinical concerns, problem identification, coping mechanisms  To promote engagement on treatment plan goals  Mental health stabilization    METHODS  Group Discussion    TOPIC  Group rules reviewed. Patients reviewed their evening/weekend including skills used and safety issues. New patients were welcomed and completed introduction.     Patient Attended: Yes,   Patient report of skills used last evening: see below  Patient report on substance use/urges: None  Patient report on safety concerns: None    Comment: Pt reports having a headache yesterday, so she went to the chiropractor instead of a yoga practice. Pt reports sitting outside, reading a book, and calming down. Pt reports this was an opposite action to going non-stop because of anxiety. Pt reports goal to ride the wave of emotions after not talking to her sister/best friend for the past year d/t her mental health, and its her sister's birthday today. Pt reports winding down before bed helped her to sleep more, getting 9 hrs. Pt denies safety concerns or SU.

## 2023-05-31 NOTE — Group Note (Signed)
Adult Partial Hospitalization Program Group Therapy Progress Note         Patient encounter was performed via Visit Completed In Person     Other participants in this encounter and roles:  other group members       Name: Caroline Blanchard  MRN: [id number]   DOB: 05-31-1991  DATE: May 31, 2023  Number of Participants: 11    Group Duration: 60 minutes     Group: Coping Skills    GOAL  To increase skills & strategies in coping for mental health stabilization  To increase patient's active involvement in their treatment  Mental health stabilization    METHODS  Group Discussion  Worksheet/Written Insurance underwriter Music    TOPIC  Self-Compassionate Letter     Patient Attended: Yes   Patient's participation in group: attentive and verbally engaged  Acute Concerns: None

## 2023-06-01 ENCOUNTER — Ambulatory Visit
Payer: No Typology Code available for payment source | Admitting: Student in an Organized Health Care Education/Training Program

## 2023-06-01 ENCOUNTER — Ambulatory Visit: Payer: No Typology Code available for payment source | Admitting: Psychiatry

## 2023-06-01 ENCOUNTER — Ambulatory Visit: Payer: No Typology Code available for payment source | Admitting: Primary Care

## 2023-06-01 ENCOUNTER — Other Ambulatory Visit: Payer: Self-pay

## 2023-06-01 DIAGNOSIS — F410 Panic disorder [episodic paroxysmal anxiety] without agoraphobia: Secondary | ICD-10-CM

## 2023-06-01 NOTE — Group Note (Signed)
Adult Partial Hospitalization Program Group Therapy Progress Note         Patient encounter was performed via Visit Completed In Person     Other participants in this encounter and roles:  other group members       Name: Daisee Centner  MRN: [id number]   DOB: 1991-03-15  DATE: June 01, 2023  Number of Participants: 9    Group also attended by [facility] intern     Group Duration: 60 minutes     Group: Process Group      GOAL  To identify and process issues related to acute clinical concerns, problem identification, coping mechanisms  To promote engagement on treatment plan goals  Mental health stabilization    METHODS  Group Discussion    TOPIC  Group rules reviewed. Patients reviewed their evening/weekend including skills used and safety issues. New patients were welcomed and completed introduction.     Patient Attended: Yes,   Patient report of skills used last evening: see below  Patient report on substance use/urges: None  Patient report on safety concerns: None    Comment: Pt reports having a good night's sleep and it feels good. Pt reports sleep has drastically improved. Pt reports the PHP structure and decompression time has been helpful. Pt reports realizing that working nights is not helpful for her. Pt reports wanting to find a new job where she can maintain this daytime structure. Pt reports a goal for planning around boundaries with work. Pt reports she texted her sister something simple but vulnerable yesterday on her birthday and she responded. Pt denies safety concerns or SU. Pt reports attending a yoga class and sitting outside reading to cope.

## 2023-06-02 NOTE — Group Note (Signed)
Adult Partial Hospitalization Program Group Therapy Progress Note         Patient encounter was performed via Visit Completed In Person     Other participants in this encounter and roles:  other group members       Name: Caroline Blanchard  MRN: [id number]   DOB: [birth year]  DATE: June 01, 2023  Number of Participants: 9    Group also attended by [facility] intern     Group Duration: 60 minutes     Group: Barriers to Recovery     GOAL  To identify symptoms of mental illness, learn about various modes of treatment, and prevent relapse  To increase patient's active involvement in their treatment  Mental health stabilization    METHODS  Group Discussion  Worksheet/Written Materials    TOPIC  Boundaries     Patient Attended: Yes   Patient's participation in group: attentive and verbally engaged  Acute Concerns: None

## 2023-06-02 NOTE — Group Note (Signed)
Adult Partial Hospitalization Program Group Therapy Progress Note         Patient encounter was performed via Visit Completed In Person     Other participants in this encounter and roles:  other group members        Name: Caroline Blanchard  MRN: [id number]   DOB: 11/17/1990  DATE: June 01, 2023  Number of Participants: 9    Group also attended by [facility] intern     Group Duration: 60 minutes     Group: Application and Review    GOAL  To review and process what the patient has learned today  To strategize how to apply the day's group material  To increase patient's active involvement in their treatment  Mental health stabilization    METHODS  Group Discussion  Worksheet/Written Materials    TOPIC  Review & Debrief    Review of community supports for crisis management completed, including Mobile Crisis Team, [facility] Crisis Call Line, and [place] and how to locate safety plan in MyChart     Patient Attended: Yes   Patient's participation in group: attentive and verbally engaged  Acute Concerns: None

## 2023-06-02 NOTE — Group Note (Signed)
Adult Partial Hospitalization Program Group Therapy Progress Note         Patient encounter was performed via Visit Completed In Person     Other participants in this encounter and roles:  other group members       Name: Anneke Cundy  MRN: [id number]   DOB: 07-19-1991  DATE: June 01, 2023  Number of Participants: 9    Group Duration: 60 minutes     Group: Coping Skills    GOAL  To increase skills & strategies in coping for mental health stabilization  To increase patient's active involvement in their treatment  Mental health stabilization    METHODS  Group Discussion  Worksheet/Written Materials    TOPIC  Assertiveness     Patient Attended: Yes   Patient's participation in group: attentive and verbally engaged  Acute Concerns: None

## 2023-06-02 NOTE — Group Note (Signed)
Adult Partial Hospitalization Program Group Therapy Progress Note         Patient encounter was performed via Visit Completed In Person     Other participants in this encounter and roles:  other group members       Name: Caroline Blanchard  MRN: [id number]   DOB: 1991/01/03  DATE: June 01, 2023  Number of Participants: 9    Group also attended by [facility] intern    Group Duration: 60 minutes     Group: Coping Skills    GOAL  To increase skills & strategies in coping for mental health stabilization  To increase patient's active involvement in their treatment  Mental health stabilization    METHODS  Group Discussion  Worksheet/Written Materials    TOPIC  TIP/STOP     Patient Attended: Yes   Patient's participation in group: attentive and verbally engaged  Acute Concerns: None

## 2023-06-04 ENCOUNTER — Other Ambulatory Visit: Payer: Self-pay

## 2023-06-04 ENCOUNTER — Ambulatory Visit: Payer: No Typology Code available for payment source | Admitting: Psychiatry

## 2023-06-04 DIAGNOSIS — F410 Panic disorder [episodic paroxysmal anxiety] without agoraphobia: Secondary | ICD-10-CM

## 2023-06-04 NOTE — Group Note (Signed)
Adult Partial Hospitalization Program Group Therapy Progress Note         Patient encounter was performed via Visit Completed In Person     Other participants in this encounter and roles:  other group members        Name: Caroline Blanchard  MRN: [id number]   DOB: Aug 31, 1990  DATE: June 04, 2023  Number of Participants: 9    Group Duration: 60 minutes     Group: Application and Review    GOAL  To review and process what the patient has learned today  To strategize how to apply the day's group material  To increase patient's active involvement in their treatment  Mental health stabilization    METHODS  Group Discussion  Worksheet/Written Materials    TOPIC    Debrief & Review    Review of community supports for crisis management completed, including Mobile Crisis Team, [facility] Crisis Call Line, and [place] and how to locate safety plan in MyChart     Patient Attended: Yes   Patient's participation in group: attentive and verbally engaged  Acute Concerns: None

## 2023-06-04 NOTE — Group Note (Signed)
Adult Partial Hospitalization Program Group Therapy Progress Note         Patient encounter was performed via Visit Completed In Person     Other participants in this encounter and roles:  other group members       Name: Caroline Blanchard  MRN: [id number]   DOB: [birth year]  DATE: June 04, 2023  Number of Participants: 9    Group Duration: 60 minutes     Group: Coping Skills    GOAL  To increase skills & strategies in coping for mental health stabilization  To increase patient's active involvement in their treatment  Mental health stabilization    METHODS  Group Discussion  Worksheet/Written Materials    TOPIC  Opposite Action     Patient Attended: Yes   Patient's participation in group: attentive and verbally engaged  Acute Concerns: None

## 2023-06-04 NOTE — Group Note (Signed)
Adult Partial Hospitalization Program Group Therapy Progress Note         Patient encounter was performed via Visit Completed In Person     Other participants in this encounter and roles:  other group members       Name: Caroline Blanchard  MRN: [id number]   DOB: 04/24/1991  DATE: June 04, 2023  Number of Participants: 9    Group Duration: 60 minutes     Group: Applied Leisure    GOAL  To learn and practice leisure activities to support stabilization and stress management  To increase patient's active engagement in their treatment  Mental health stabilization    METHODS  Group Discussion and Activities  Hands on Use of Leisure Activities and/or Creative Arts Materials    TOPIC  Therapeutic Recreation     Patient Attended: Yes   Patient's participation in group: attentive and verbally engaged  Acute Concerns: None

## 2023-06-04 NOTE — Progress Notes (Signed)
Adult Partial Hospitalization Program  Clinical Management Note   Name: Caroline Blanchard  MRN: [id number]  DOB: 1991/02/05    06/04/2023    Pt gave front desk staff note for writer that pt is scheduled to see Caroline Blanchard PMHNP on 06-12-23 at 9 am via tele health. Add to flowsheet.   Pt will see her therapist [name] on 06-14-2023 at 11 am via telehealth.

## 2023-06-04 NOTE — Group Note (Signed)
Adult Partial Hospitalization Program Group Therapy Progress Note         Patient encounter was performed via Visit Completed In Person     Other participants in this encounter and roles:  other group members       Name: Caroline Blanchard  MRN: [id number]   DOB: 02-15-1991  DATE: June 04, 2023  Number of Participants: 9    Group Duration: 60 minutes     Group: Process Group      GOAL  To identify and process issues related to acute clinical concerns, problem identification, coping mechanisms  To promote engagement on treatment plan goals  Mental health stabilization    METHODS  Group Discussion    TOPIC  Group rules reviewed. Patients reviewed their evening/weekend including skills used and safety issues. New patients were welcomed and completed introduction.     Patient Attended: Yes,   Patient report of skills used last evening: see below   Patient report on substance use/urges: None  Patient report on safety concerns: Yes, comment see below     Comment: Pt reports feeling heavy this morning trying to process the weekend and her feelings. Pt reports feeling increased body pain, heaviness nausea, and headaches. Pt reports doing opposite action but struggled to cope. Pt reports bad TMJ and headaches. Pt reports feeling unheard and experiencing medical barriers. Pt reports experiencing a lot of pain and feels that she cannot exercise to cope as she would like to. Pt reports increased anxiety about leaving PHP and feeling unsafe/disconnected outside of PHP. Pt reports getting a foot massage yesterday to cope. Pt reports meditating and walking but still struggling to feel grounded. Pt reports passive SI yesterday. Pt denies SU or cravings. Pt denies current safety concerns.

## 2023-06-04 NOTE — Group Note (Signed)
Adult Partial Hospitalization Program Group Therapy Progress Note         Patient encounter was performed via Visit Completed In Person     Other participants in this encounter and roles:  other group members       Name: Caroline Blanchard  MRN: [id number]   DOB: [birth year]  DATE: June 04, 2023  Number of Participants: 9    Group Duration: 60 minutes     Group: Symptom Management     GOAL  To identify symptoms of mental illness, learn about various modes of treatment, and prevent relapse  To increase patient's active involvement in their treatment  Mental health stabilization    METHODS  Group Discussion  Worksheet/Written Materials    TOPIC  Anxiety     Patient Attended: Yes   Patient's participation in group: attentive and verbally engaged  Acute Concerns: None

## 2023-06-05 ENCOUNTER — Ambulatory Visit: Payer: No Typology Code available for payment source | Admitting: Psychiatry

## 2023-06-05 DIAGNOSIS — F331 Major depressive disorder, recurrent, moderate: Secondary | ICD-10-CM

## 2023-06-05 DIAGNOSIS — F410 Panic disorder [episodic paroxysmal anxiety] without agoraphobia: Secondary | ICD-10-CM

## 2023-06-05 NOTE — Group Note (Signed)
Adult Partial Hospitalization Program Group Therapy Progress Note         Patient encounter was performed via Visit Completed In Person     Other participants in this encounter and roles:  other group members       Name: Caroline Blanchard  MRN: [id number]   DOB: 1991/02/21  DATE: June 05, 2023  Number of Participants: 10    Group also attended by [facility] intern    Group Duration: 60 minutes     Group: Coping Skills    GOAL  To increase skills & strategies in coping for mental health stabilization  To increase patient's active involvement in their treatment  Mental health stabilization    METHODS  Group Discussion  Worksheet/Written Materials    TOPIC  Non-Productive Learned Behaviors     Patient Attended: Yes   Patient's participation in group: attentive and verbally engaged  Acute Concerns: None

## 2023-06-05 NOTE — Group Note (Signed)
Adult Partial Hospitalization Program Group Therapy Progress Note         Patient encounter was performed via Visit Completed In Person     Other participants in this encounter and roles:  other group members       Name: Caroline Blanchard  MRN: [id number]   DOB: [birth year]  DATE: June 05, 2023  Number of Participants: 10    Group also attended by [facility] intern    Group Duration: 60 minutes     Group: Relapse Prevention     GOAL  To identify warning signs and prevent relapse  To increase patient's active involvement in their treatment  Mental health stabilization    METHODS  Group Discussion  Worksheet/Written Materials    TOPIC  Recovery Story Activity     Patient Attended: Yes   Patient's participation in group: attentive and verbally engaged  Acute Concerns: None

## 2023-06-05 NOTE — Progress Notes (Signed)
Behavioral Health Psychopharmacology Follow-up     Diagnosis Addressed  Diagnoses   Code Name Primary?    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  Wt Readings from Last 3 Encounters:   05/24/23 80.7 kg (178 lb)   05/23/23 80.6 kg (177 lb 12.8 oz)   05/10/23 81.5 kg (179 lb 9.6 oz)       Recent History and Response to Medications  Pt reports last week was pretty good. This week she is anxious as program is ending and she will have to go back to "the real world." Reports she is more anxious so she has been more emotional as a result. She reports she is worried about her next steps after. Pt reports sometimes she just wants the suffering to end but denies SI, plan or intent. Reports this has been moreso the past few days due to emotions. She has been using coping skills to help with this - was working at [employer]ilities [employer], radical acceptance. Has been doing yoga classes. Is trying to continue to check things off her list and make a plan. Reports she is worried about her work situation - she was not put on the schedule. She will have to reach out about this. Reports she feels constipated, has not had a solid bowel movement. Feels like she is not emptying her bowels. Has tried gasx, peptobismal but no OTC laxative. Finding the lamotrigine to be helpful. Reports the week before she gets her menstrual cycle, she is very emotional. She is due for her period next week. Has really enjoyed being at program.     Current use of alcohol or drugs: no    ENERGY: Good  SLEEP: Normal.    APPETITE: Not assessed at this time  WEIGHT: Not assessed at this time  SEXUAL FUNCTION: not asked  ENJOYMENT/INTEREST: Good    Review of Systems:  Constitutional: negative, Gastrointestinal: positive for constipation and abdominal pain, Neurological: negative    Current  Medications  Current Outpatient Medications   Medication Sig    carboxymethylcellulose-glycerin (REFRESH OPTIVE) 0.5-0.9 % SOLN ophthalmic solution Place 2 drops into both eyes daily as needed (dry red eyes).    EPINEPHrine (EPIPEN) 0.3 mg/0.3 mL auto-injector Inject 0.3 mLs (0.3 mg total) into the muscle as needed (difficulty swallowing).    ketoconazole (NIZORAL) 2 % shampoo Apply topically daily.    pseudoephedrine 30 mg tablet Take 1 tablet (30 mg total) by mouth daily.    Doxylamine Succinate (SLEEP AID, DOXYLAMINE,) 25 MG tablet Take 1 tablet (25 mg total) by mouth nightly as needed for Sleep.    SUMAtriptan (IMITREX) 50 mg tablet Take 1 tablet (50 mg total) by mouth as needed for Migraine. Take at onset of headache. May repeat once in 2 hours.    lamoTRIgine (LAMICTAL) 150 mg tablet Take 1 tablet (150 mg total) by mouth daily.    ALPRAZolam 0.25 mg tablet Take 1 tablet (0.25 mg total) by mouth daily as needed.    diphenhydrAMINE HCl (BENADRYL PO) Take 25 mg by mouth daily.    Acetaminophen (TYLENOL PO) Take 1,000 mg by mouth daily.     No current facility-administered medications for this visit.       Side Effects  None    Mental Status  APPEARANCE:  Appears stated age, Casual  ATTITUDE TOWARD INTERVIEWER: Cooperative  MOTOR ACTIVITY: WNL (within normal limits)  EYE CONTACT: Direct  SPEECH: Normal rate and tone and Age appropriate  AFFECT: Full Range and Pleasant  MOOD: Anxious  THOUGHT PROCESS: Intact  THOUGHT CONTENT: No unusual themes  PERCEPTION: Within normal limits  ORIENTATION: Alert and Oriented X 3.  CONCENTRATION: WNL  MEMORY:   Recent: intact   Remote: intact  COGNITIVE FUNCTION: Average intelligence  JUDGMENT: Intact  IMPULSE CONTROL: Intact  INSIGHT: Good    Risk Assessment  Self Injury: Patient Denies  Suicidal Ideation: Patient Denies  Homicidal Ideation: Patient Denies  Aggressive Behavior: Patient Denies    If any of the answers above are Yes, is there access to lethal means?    N/A      Results  All labs in the last month:    Hospital Outpatient Visit on 05/14/2023   Component Date Value Ref Range Status    Hemoglobin A1C 05/14/2023 5.1  % Final    Comment: Ref Range <=5.6  HbA1c values of 5.7-6.4% indicate an increased risk for developing  diabetes mellitus.  HbA1c values greater than or equal to 6.5% are diagnostic of  diabetes mellitus.  For diagnosis of diabetes in individuals without unequivocal  hyperglycemia, results should be confirmed by repeat testing.      Vitamin B12 05/14/2023 1,839 (H)  232 - 1,245 pg/mL Final    25-OH Vit Total 05/14/2023 29 (L)  30 - 60 ng/mL Final    Comment: Reference Ranges:    < 20   ng/mL   Moderate Risk of Deficiency  20-29  ng/mL   Low Risk of Deficiency  30-60  ng/mL   Adequate  > 60   ng/mL   Potentially Harmful    This test may underestimate total Vit D level if patient is on Vit D2.  If patient is on Vit D2 order the Vit D2-D3 test by LCMS.      TSH 05/14/2023 1.88  0.27 - 4.20 uIU/mL Final       Current Treatment Plan   Plans of Care       Current Plans       Plan Status Workflow Plan Type Effective From Effective To    [facility] PHP Treatment Plan 05/25/23 Active Behavioral Health Treatment Plan  05/25/2023 06/08/2023                    Assessment:  Pt was seen for scheduled psychopharm follow up. Pt is reporting an increase in anxiety in the last few days r/t discharging from program tomorrow. She is nervous about work. We discussed that this is a normal experience for people as they come close to discharge. Pt reports overall she is managing her symptoms well. She is using coping skills and finding it helpful. She reports some recent constipation/upset stomach. She has tried gasx and pepto-bismal. She has been getting adequate fluids and working on increasing fiber. We discussed option to use an OTC bulk-forming laxative or osmotic laxative. Pt reports she will consider giving this a try when she finishes program as she worries about side effects  while here. No further medication changes are indicated at this time. Pt denies safety concerns at this time.       Plan and Rationale:  Recommended OTC laxatives  Continue current medications  No further nursing follow up anticipated unless pt requests or is clinically indicated

## 2023-06-05 NOTE — Group Note (Signed)
Adult Partial Hospitalization Program Group Therapy Progress Note         Patient encounter was performed via Visit Completed In Person     Other participants in this encounter and roles:  other group members       Name: Caroline Blanchard  MRN: [id number]   DOB: 1991-06-03  DATE: June 05, 2023  Number of Participants: 48    Group also attended by [facility] intern    Group Duration: 60 minutes     Group: Process Group      GOAL  To identify and process issues related to acute clinical concerns, problem identification, coping mechanisms  To promote engagement on treatment plan goals  Mental health stabilization    METHODS  Group Discussion    TOPIC  Group rules reviewed. Patients reviewed their evening/weekend including skills used and safety issues. New patients were welcomed and completed introduction.     Patient Attended: Yes,   Patient report of skills used last evening: see below  Patient report on substance use/urges: None  Patient report on safety concerns: None    Comment: Pt reports struggling with anxiety all day at [facility] yesterday. Pt reports feeling on the brink of a panic attack and was able to ride the wave to prevent one. Pt reports relaxing after PHP, walking the dogs and watching TV. Pt reports continued GI distress. Pt reports calming down during yoga class but something someone said triggered her and she cried for a couple hrs after the class. Pt reports using the de-catastrophizing worksheet and radical acceptance to help her to emotionally regulate. Pt reports distress d/t reports not being on the schedule at work which is making her feel rejected. Pt presents as tearful. Pt denies safety concerns or SU.

## 2023-06-05 NOTE — Group Note (Signed)
Adult Partial Hospitalization Program Group Therapy Progress Note         Patient encounter was performed via Visit Completed In Person     Other participants in this encounter and roles:  other group members       Name: Caroline Blanchard  MRN: [id number]   DOB: 1991/01/22  DATE: June 05, 2023  Number of Participants: 61    Group also attended by [facility] intern    Group Duration: 60 minutes     Group: Health and Wellness    GOAL  To learn about impact of physical health issues and healthy living  To increase patient's active involvement in their treatment  Mental health stabilization    METHODS  Group Discussion and Worksheet/Written Materials    TOPIC  Sleep     Patient Attended: Yes   Patient's participation in group: attentive and verbally engaged  Acute Concerns: None

## 2023-06-05 NOTE — Group Note (Signed)
Adult Partial Hospitalization Program Group Therapy Progress Note         Patient encounter was performed via Visit Completed In Person     Other participants in this encounter and roles:  other group members        Name: Caroline Blanchard  MRN: [id number]   DOB: Jun 22, 1991  DATE: June 05, 2023  Number of Participants: 10    Group also attended by [facility]     Group Duration: 60 minutes     Group: Application and Review    GOAL  To review and process what the patient has learned today  To strategize how to apply the day's group material  To increase patient's active involvement in their treatment  Mental health stabilization    METHODS  Group Discussion  Worksheet/Written Materials    TOPIC  Debrief & Review    Review of community supports for crisis management completed, including Mobile Crisis Team, [facility] Crisis Call Line, and [place] and how to locate safety plan in MyChart     Patient Attended: Yes   Patient's participation in group: attentive and verbally engaged  Acute Concerns: None

## 2023-06-06 ENCOUNTER — Ambulatory Visit: Payer: No Typology Code available for payment source | Admitting: Psychiatry

## 2023-06-06 ENCOUNTER — Other Ambulatory Visit: Payer: Self-pay

## 2023-06-06 DIAGNOSIS — F331 Major depressive disorder, recurrent, moderate: Secondary | ICD-10-CM

## 2023-06-06 DIAGNOSIS — F410 Panic disorder [episodic paroxysmal anxiety] without agoraphobia: Secondary | ICD-10-CM

## 2023-06-06 MED ORDER — LAMOTRIGINE 150 MG PO TABS *I'
150.0000 mg | ORAL_TABLET | Freq: Every day | ORAL | 0 refills | Status: DC
Start: 2023-06-06 — End: 2023-08-07
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
## 2023-06-06 NOTE — Progress Notes (Signed)
Adult Partial Hospitalization Program   Primary Therapist Note - Discharge     Name: Caroline Blanchard  MRN: [id number]  DOB: 05/26/1991    Date of Service: 06/06/2023  Length of session: 68 min    Contact Type:  Individual Psychotherapy     Risk Assessment  SELF-INJURY:Patient denies  SUICIDAL IDEATION: Vague/passive suicidal ideation  HOMICIDAL IDEATION: Patient denies  AGGRESSIVE BEHAVIOR: No evidence    Suicide risk assessed and updated   No changes from baseline Preadmission Lethality Assessment    Violence risk assessed and updated   Violence risk was assessed and No Change noted from baseline formulation of risk and/or previous assessment.  Review Flowsheet          06/04/2023 05/23/2023 05/22/2023 05/10/2023   Dates   PHQ9 Q9 - Better Off Dead 0 0 0 1   PHQ9 Total Score 6 12 12 20    PHQ9 Severity Level Mild Moderate Moderate Severe      Details                     06/04/2023    12:00 PM 05/22/2023     1:00 PM   GAD-7 Dates   Total Score 9 19     Session Content  Writer met with pt individually to review discharge instructions. Pt improved while in [facility]. Pt was able to actively work on anxiety sx and coping. She utilized skills such as stress management, self-soothing techniques, mindfulness, interpersonal skills, and healthy boundaries. Pt also benefited from topics around radical acceptance. Pt continued to struggle with feeling overwhelmed and wanting to be back to her baseline. However, pt was able to identify that it's a process that she is in. Pt's safety concerns varied.    Pt's current concerns are how to maintain boundaries at work and that her parents are moving in the next month forcing her to either move with them or find her own apartment on a more sped up timetable than she wanted.    [name] completed the PHQ-9 &GAD-7 assessments (see above).  Pt and Clinical research associate reviewed recent scores versus at [facility] intake appointment and discussed her improvement by the numbers.     Pt and writer reviewed discharge  instructions, and pt was given a copy. Pt and Clinical research associate updated / reviewed safety plan. Pt has access in MyChart.       Patient's discharge plan includes individual therapy, medication management, and recommendation to utilize services through [facility], [name], and/or [facility].    Patient declined recommended referrals of N/A.    Patient may benefit in the future from consideration of referral for family therapy.    Interventions/Plan  Discharge from [facility] today.  Pt is well-versed in the discharge plan and safety plan.

## 2023-06-06 NOTE — Group Note (Signed)
Adult Partial Hospitalization Program Group Therapy Progress Note         Patient encounter was performed via Visit Completed In Person     Other participants in this encounter and roles:  other group members        Name: Jazari Ober  MRN: [id number]   DOB: 1991/08/20  DATE: June 06, 2023  Number of Participants: 10    Group also attended by [facility] intern    Group Duration: 60 minutes     Group: Application and Review    GOAL  To review and process what the patient has learned today  To strategize how to apply the day's group material  To increase patient's active involvement in their treatment  Mental health stabilization    METHODS  Group Discussion  Worksheet/Written Materials    TOPIC  Perfectionism, Core Beliefs & Review    Review of community supports for crisis management completed, including Mobile Crisis Team, [facility] Crisis Call Line, and [place] and how to locate safety plan in MyChart     Patient Attended: Yes   Patient's participation in group: attentive and verbally engaged  Acute Concerns: None

## 2023-06-06 NOTE — Group Note (Signed)
Adult Partial Hospitalization Program Group Therapy Progress Note         Patient encounter was performed via Visit Completed In Person     Other participants in this encounter and roles:  other group members       Name: Shalicia Craghead  MRN: [id number]   DOB: 1991-07-16  DATE: June 06, 2023  Number of Participants: 10    Group also attended by [facility] intern    Group Duration: 60 minutes     Group: Applied Relaxation    GOAL  To learn and practice relaxation exercises to support stabilization and stress management  To increase patient's active involvement in their treatment  Mental health stabilization    METHODS  Group Discussion  Relaxation Instructional Video    TOPIC  [place] and Tai Chi     Patient Attended: Yes   Patient's participation in group: attentive and verbally engaged  Acute Concerns: None

## 2023-06-06 NOTE — Group Note (Signed)
Adult Partial Hospitalization Program Group Therapy Progress Note         Patient encounter was performed via Visit Completed In Person     Other participants in this encounter and roles:  other group members       Name: Caroline Blanchard  MRN: [id number]   DOB: 1991-04-09  DATE: June 06, 2023  Number of Participants: 10    Group also attended by [facility] intern    Group Duration: 60 minutes     Group: Process Group      GOAL  To identify and process issues related to acute clinical concerns, problem identification, coping mechanisms  To promote engagement on treatment plan goals  Mental health stabilization    METHODS  Group Discussion    TOPIC  Group rules reviewed. Patients reviewed their evening/weekend including skills used and safety issues. New patients were welcomed and completed introduction.     Patient Attended: Yes,   Patient report of skills used last evening: see below  Patient report on substance use/urges: None  Patient report on safety concerns: None    Comment: Pt reports struggling last night after receiving bad news. Pt reports using skills, running and walking to cope and calm down. Pt reports feeling in a lot of pain and wanted to talk to someone, so she called [phone] despite not having safety concerns and it was helpful. Pt reports a bad headache this morning and fatigue. Pt reports feeling emotional and bittersweet around today being her last day at [facility]. Pt denies safety concerns or SU.

## 2023-06-06 NOTE — Progress Notes (Addendum)
Adult Partial Hospitalization Program  RN Clinical Management  Note  Discharge Medications   Name: Caroline Blanchard  MRN: [id number]  DOB: 08/13/1991    06/06/2023  Met with pt briefly to assess meds for planned discharge. Patient reported medication compliance; tolerating well with no reported adverse side effects. Patient confirmed pharmacy on file is correct.    Brief chart review to assess meds for planned discharge; Reviewed NP/MD Provider plan:       05/28/23:    Continue current medications  Psychotherapy continues as described in care plan; plan remains the same.  See [facility] Multidisciplinary Treatment Plan for short and long term goals related to treatment of the reason for admission to the Partial Hospitalization Program.  Will be seen for follow-up by NP in 1 week.  Anticipate discharge to return to current specialty provider for psychopharm aftercare.    06/05/23:    Recommended OTC laxatives  Continue current medications  No further nursing follow up anticipated unless pt requests or is clinically indicated    Reviewed Daily Med Grid for AVS  Current Outpatient Medications   Medication Sig    carboxymethylcellulose-glycerin (REFRESH OPTIVE) 0.5-0.9 % SOLN ophthalmic solution Place 2 drops into both eyes daily as needed (dry red eyes).    EPINEPHrine (EPIPEN) 0.3 mg/0.3 mL auto-injector Inject 0.3 mLs (0.3 mg total) into the muscle as needed (difficulty swallowing).    ketoconazole (NIZORAL) 2 % shampoo Apply topically daily.    pseudoephedrine 30 mg tablet Take 1 tablet (30 mg total) by mouth daily.    Doxylamine Succinate (SLEEP AID, DOXYLAMINE,) 25 MG tablet Take 1 tablet (25 mg total) by mouth nightly as needed for Sleep.    SUMAtriptan (IMITREX) 50 mg tablet Take 1 tablet (50 mg total) by mouth as needed for Migraine. Take at onset of headache. May repeat once in 2 hours.    lamoTRIgine (LAMICTAL) 150 mg tablet Take 1 tablet (150 mg total) by mouth daily.    ALPRAZolam 0.25 mg tablet Take 1 tablet (0.25 mg  total) by mouth daily as needed.    diphenhydrAMINE HCl (BENADRYL PO) Take 25 mg by mouth daily.    Acetaminophen (TYLENOL PO) Take 1,000 mg by mouth daily.        iStop completed  Queued up for NP/MD Provider for review & signature d/c meds; 30 day supply 0 refills.         Updated Preferred Pharmacy:    [facility] [phone] [place], [place] - [address]  [address]  [place] [address]  Phone: [phone] Fax: [phone]

## 2023-06-06 NOTE — Progress Notes (Signed)
Adult Partial Hospitalization Program   Discharge Instructions     Name: Caroline Blanchard  MRN: [id number]   DOB: 04-30-1991    DISCHARGE INSTRUCTIONS:      06/06/2023   R PSY DATE ADMITTED   DATE ADMITTED TO CURRENT [facility] PROGRAM 05/24/2023            06/06/2023   R PSY DC DATE   DISCHARGE DATE 06/06/2023          Current Outpatient Medications   Medication Sig    carboxymethylcellulose-glycerin (REFRESH OPTIVE) 0.5-0.9 % SOLN ophthalmic solution Place 2 drops into both eyes daily as needed (dry red eyes).    EPINEPHrine (EPIPEN) 0.3 mg/0.3 mL auto-injector Inject 0.3 mLs (0.3 mg total) into the muscle as needed (difficulty swallowing).    ketoconazole (NIZORAL) 2 % shampoo Apply topically daily.    pseudoephedrine 30 mg tablet Take 1 tablet (30 mg total) by mouth daily.    Doxylamine Succinate (SLEEP AID, DOXYLAMINE,) 25 MG tablet Take 1 tablet (25 mg total) by mouth nightly as needed for Sleep.    SUMAtriptan (IMITREX) 50 mg tablet Take 1 tablet (50 mg total) by mouth as needed for Migraine. Take at onset of headache. May repeat once in 2 hours.    lamoTRIgine (LAMICTAL) 150 mg tablet Take 1 tablet (150 mg total) by mouth daily.    ALPRAZolam 0.25 mg tablet Take 1 tablet (0.25 mg total) by mouth daily as needed.    diphenhydrAMINE HCl (BENADRYL PO) Take 25 mg by mouth daily.    Acetaminophen (TYLENOL PO) Take 1,000 mg by mouth daily.     No current facility-administered medications for this visit.       We have provided enough medication to last you until your next psychiatric assessment.  Refills or changes in medication must be done by your new Outpatient Provider.  It is VITAL that you keep your scheduled appointments.    Patient Instructions  FOLLOW-UP PROVIDER APPOINTMENTS:      05/22/2023   Type   -- Therapist            05/22/2023   Name   -- [name]            05/29/2023   Date   Date of Appointment 06/14/2023            05/29/2023   Time   Time of Appointment 11:00            05/22/2023   Phone   -- [phone]             05/29/2023   Fax   -- [email or web address]            05/29/2023   Address   -- via tele health        -------------------------------------------------------------              05/22/2023   Type   -- NP            05/22/2023   Name   -- Jacquiline Doe, PMHNP (Psych NP)            06/04/2023   Date   Date of Appointment 06/12/2023            06/04/2023   Time   Time of Appointment 09:00            05/22/2023   Phone   -- [phone]  05/22/2023   Fax   -- [phone]            06/04/2023   Address   -- tele health      _______________________________________________  Primary Care Physician: Dolphus Jenny, NP  Phone: [phone]  Fax: [phone]  Address:  [address]  [place] [address]  See your doctor as needed for your medical care.    REASON FOR DISCHARGE:  No longer requires this level of care    GENERAL INSTRUCTIONS: [facility] Helpline/Mobile Crisis Team: [phone] (24 hours/7 days); TTY [phone]; Out of [place]. Recommendations/Diagnosis/Additional Instructions: N/A    The above information has been discussed with me and I have received a copy.  I understand that I am advised to follow the instructions given to me to appropriately care for my condition.

## 2023-06-06 NOTE — Group Note (Signed)
Adult Partial Hospitalization Program Group Therapy Progress Note         Patient encounter was performed via Visit Completed In Person     Other participants in this encounter and roles:  other group members       Name: Caroline Blanchard  MRN: [id number]   DOB: 1990/12/02  DATE: June 06, 2023  Number of Participants: 10    Group also attended by [facility] intern     Group Duration: 60 minutes     Group: Relapse Prevention     GOAL  To identify warning signs and prevent relapse  To increase patient's active involvement in their treatment  Mental health stabilization    METHODS  Group Discussion  Worksheet/Written Materials    TOPIC  Perfectionism     Patient Attended: Yes   Patient's participation in group: attentive and verbally engaged  Acute Concerns: None

## 2023-06-06 NOTE — Group Note (Signed)
Adult Partial Hospitalization Program Group Therapy Progress Note         Patient encounter was performed via Visit Completed In Person     Other participants in this encounter and roles:  other group members       Name: Caroline Blanchard  MRN: [id number]   DOB: [birth year]  DATE: June 06, 2023  Number of Participants: 10    Group also attended by [facility] intern    Group Duration: 60 minutes     Group: Barriers to Recovery     GOAL  To identify symptoms of mental illness, learn about various modes of treatment, and prevent relapse  To increase patient's active involvement in their treatment  Mental health stabilization    METHODS  Group Discussion  Worksheet/Written Materials    TOPIC  Self-Esteem     Patient Attended: Yes   Patient's participation in group: attentive and verbally engaged  Acute Concerns: None

## 2023-06-07 ENCOUNTER — Encounter: Payer: Self-pay | Admitting: Psychiatry

## 2023-06-07 NOTE — Progress Notes (Signed)
Adult Partial Hospitalization Program  Clinical Management Note   Name: Caroline Blanchard  MRN: [id number]  DOB: 07/25/1991    06/07/2023    Pt called writer tearful and stating that she had been fired from her job and she didn't know who else to call. Writer validated pt's emotions and shared [phone] as a number to call going forward since pt is no longer enrolled in [facility]. Pt was thankful, calmed, and ended call appropriately.

## 2023-06-13 ENCOUNTER — Encounter: Payer: Self-pay | Admitting: Urology

## 2023-06-13 NOTE — Telephone Encounter (Signed)
Appointment canceled and patient states will call back to schedule

## 2023-06-15 ENCOUNTER — Ambulatory Visit
Payer: Auto Insurance (includes no fault) | Attending: Student in an Organized Health Care Education/Training Program | Admitting: Primary Care

## 2023-06-15 ENCOUNTER — Ambulatory Visit: Payer: No Typology Code available for payment source | Admitting: Urology

## 2023-06-15 ENCOUNTER — Other Ambulatory Visit: Payer: Self-pay

## 2023-06-15 DIAGNOSIS — R519 Headache, unspecified: Secondary | ICD-10-CM

## 2023-06-15 DIAGNOSIS — M549 Dorsalgia, unspecified: Secondary | ICD-10-CM

## 2023-06-15 DIAGNOSIS — M542 Cervicalgia: Secondary | ICD-10-CM

## 2023-06-15 DIAGNOSIS — M79602 Pain in left arm: Secondary | ICD-10-CM
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
## 2023-06-18 NOTE — Addendum Note (Signed)
Addended by: Dolphus Jenny on: 06/18/2023 12:11 PM     Modules accepted: Orders

## 2023-06-19 ENCOUNTER — Telehealth: Payer: Self-pay

## 2023-06-19 NOTE — Telephone Encounter (Signed)
MRI Abdomen w/ & w/o  [facility]  07/04/2023  Arrival: 5:30pm  Prep: NPO for 6hrs  Auth: #[id number]  Left message with patient with info

## 2023-06-20 ENCOUNTER — Encounter: Payer: Self-pay | Admitting: Gastroenterology

## 2023-06-20 ENCOUNTER — Telehealth: Payer: Self-pay | Admitting: Primary Care

## 2023-06-20 ENCOUNTER — Ambulatory Visit: Payer: No Typology Code available for payment source | Admitting: Primary Care

## 2023-06-20 ENCOUNTER — Ambulatory Visit
Admission: RE | Admit: 2023-06-20 | Discharge: 2023-06-20 | Disposition: A | Discharge status: Home / Self Care | Payer: No Typology Code available for payment source | Source: Ambulatory Visit

## 2023-06-20 ENCOUNTER — Other Ambulatory Visit: Payer: Self-pay

## 2023-06-20 ENCOUNTER — Ambulatory Visit
Admission: RE | Admit: 2023-06-20 | Discharge: 2023-06-20 | Disposition: A | Discharge status: Home / Self Care | Payer: No Typology Code available for payment source | Source: Ambulatory Visit | Attending: Primary Care | Admitting: Primary Care

## 2023-06-20 DIAGNOSIS — S199XXA Unspecified injury of neck, initial encounter: Secondary | ICD-10-CM | POA: Insufficient documentation

## 2023-06-20 DIAGNOSIS — S0990XA Unspecified injury of head, initial encounter: Secondary | ICD-10-CM | POA: Insufficient documentation

## 2023-06-20 DIAGNOSIS — Y9289 Other specified places as the place of occurrence of the external cause: Secondary | ICD-10-CM | POA: Insufficient documentation
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
## 2023-06-22 ENCOUNTER — Encounter: Payer: Self-pay | Admitting: Primary Care

## 2023-06-22 ENCOUNTER — Other Ambulatory Visit: Payer: Self-pay

## 2023-06-22 ENCOUNTER — Ambulatory Visit
Payer: No Typology Code available for payment source | Attending: Student in an Organized Health Care Education/Training Program | Admitting: Primary Care

## 2023-06-22 VITALS — HR 83 | Temp 97.7°F | Ht 64.0 in | Wt 178.3 lb

## 2023-06-22 DIAGNOSIS — F410 Panic disorder [episodic paroxysmal anxiety] without agoraphobia: Secondary | ICD-10-CM

## 2023-06-22 DIAGNOSIS — G8929 Other chronic pain: Secondary | ICD-10-CM

## 2023-06-22 DIAGNOSIS — F411 Generalized anxiety disorder: Secondary | ICD-10-CM

## 2023-06-22 DIAGNOSIS — R519 Headache, unspecified: Secondary | ICD-10-CM
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
## 2023-06-24 ENCOUNTER — Encounter: Payer: Self-pay | Admitting: Primary Care

## 2023-07-02 ENCOUNTER — Encounter: Payer: Self-pay | Admitting: Primary Care

## 2023-07-09 ENCOUNTER — Encounter: Payer: Self-pay | Admitting: Primary Care

## 2023-07-11 ENCOUNTER — Encounter: Payer: Self-pay | Admitting: Primary Care

## 2023-07-12 ENCOUNTER — Encounter: Payer: Self-pay | Admitting: Primary Care

## 2023-07-12 NOTE — Telephone Encounter (Signed)
Spoke with [facility], she will be coming in on Monday with Caroline Blanchard.

## 2023-07-12 NOTE — Telephone Encounter (Signed)
I sent her a mychart message for 11:40 with Noreene Larsson today and then I called her as well and left her a message to see if she could do 2pm with Synetta Fail instead if she can't make 11:40.  Please keep those spots open to her

## 2023-07-16 ENCOUNTER — Ambulatory Visit: Payer: No Typology Code available for payment source | Attending: Primary Care | Admitting: Primary Care

## 2023-07-16 ENCOUNTER — Other Ambulatory Visit: Payer: Self-pay

## 2023-07-16 ENCOUNTER — Ambulatory Visit: Payer: No Typology Code available for payment source | Admitting: Primary Care

## 2023-07-16 VITALS — BP 120/80 | HR 87 | Temp 97.2°F | Wt 174.0 lb

## 2023-07-16 DIAGNOSIS — R519 Headache, unspecified: Secondary | ICD-10-CM

## 2023-07-16 DIAGNOSIS — F32A Depression, unspecified: Secondary | ICD-10-CM

## 2023-07-16 DIAGNOSIS — J329 Chronic sinusitis, unspecified: Secondary | ICD-10-CM

## 2023-07-16 DIAGNOSIS — N6310 Unspecified lump in the right breast, unspecified quadrant: Secondary | ICD-10-CM

## 2023-07-16 DIAGNOSIS — R42 Dizziness and giddiness: Secondary | ICD-10-CM

## 2023-07-16 DIAGNOSIS — F419 Anxiety disorder, unspecified: Secondary | ICD-10-CM

## 2023-07-16 DIAGNOSIS — N644 Mastodynia: Secondary | ICD-10-CM

## 2023-07-16 DIAGNOSIS — G8929 Other chronic pain: Secondary | ICD-10-CM

## 2023-07-16 MED ORDER — CYCLOBENZAPRINE HCL 5 MG PO TABS *I*
5.0000 mg | ORAL_TABLET | Freq: Three times a day (TID) | ORAL | 1 refills | Status: DC | PRN
Start: 2023-07-16 — End: 2024-02-09
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
## 2023-07-17 ENCOUNTER — Encounter: Payer: Self-pay | Admitting: Primary Care

## 2023-07-18 ENCOUNTER — Telehealth: Payer: Self-pay

## 2023-07-18 ENCOUNTER — Encounter: Payer: Self-pay | Admitting: Primary Care

## 2023-07-18 NOTE — Progress Notes (Unsigned)
07/18/23 1805   Crisis Call   Call Duration (minutes) 10   [facility] Call Type Telephonic Crisis Intervention   CCL Intervention Interventions Declined

## 2023-07-18 NOTE — Telephone Encounter (Deleted)
Crisis Call Line - Call Note     Call Narrative/Intervention: Pt called the Crisis Call Line and reported that she is very emotional today , She reported that she does not have a therapist at this time as her therapist she has been seeing has advised her that she can not be seen anymore. She reported that she has not had time to find a new therapist. She reported that she sees a psychiatrist via zoom Dr. Tawanna Cooler for medication management. Pt became tearful as she told the writer that she does not have time for herself and she has been focusing on self care and she has an appt for a facial at 6:30pm . and she was not certain if she can make the appt. She told the writer that she was having a hard time breathing and she was afraid that she was going to pass out. The writer attempted to discuss coping skills . The Clinical research associate discussed with the pt options to help support her at the time of the call and offered to complete a MCT referral so she can meet with the MCT and be linked with a therapist. She declined a MCT referral and said that she was going to call her parents.     Safety Concerns/Planning: pt denied any SI/HI plan or intent       Crisis Call Information  Call Duration (minutes): 10  CCL Call Type: Telephonic Crisis Intervention  [facility]L Intervention: Interventions Declined

## 2023-07-18 NOTE — Progress Notes (Unsigned)
Crisis Call Line - Call Note     Call Narrative/Intervention: Pt called the CCL and reported that she was upset . She told the Clinical research associate that she sees a psychiatrist via zoom although she doesn't not have a therapist at this time. She explained that her therapist told her that she can not return and needs to find another provider. Pt was tearful as she stated that she does not have for herself and she has an appt for a facial for self care but she is uncertain If she can get herself there. Pt declined linked to treatment and would not accept a MCT referral at the time of the call .     Safety Concerns/Planning:  Pt denied any SI/HI plan or intent. Pt was not willing to explore linkage to MH treatment and declined a MCT referral.      {Crisis Call Flowsheet   Click on link to update call information and click refresh to pull into note.   :[id number]}  Crisis Call Information  Call Duration (minutes): 10  CCL Call Type: Telephonic Crisis Intervention  CCL Intervention: Interventions Declined

## 2023-07-19 ENCOUNTER — Encounter: Payer: Self-pay | Admitting: Primary Care

## 2023-07-19 NOTE — Progress Notes (Deleted)
[facility]     Subjective   Caroline Blanchard is a 32 y.o. female with past medical history of anxiety, anemia, and depression.   [place] and [place] to back to the [place] area to live with her parents in September 2023. Caroline Blanchard is currently not working secondary to mental health stressors. Patient continues to work with both psychiatrist and a therapist. Patient has recently completed a partial inpatient program with good results(05/2023).    Patient often messages through the MyChart weekly for multiple reasons such as pain, acid reflux, nausea, and an increase in anxiety. Medications not given with these interactions.  Patient now follows with Erby Pian NP for her headaches.   07/16/2023 Seen in the office by another provider for headaches, chest pain, and sinus pressure.   11/202/2024 Patient called the crisis line: interventions declined.       Caroline Blanchard is a 32 y.o. female who presents for No chief complaint on file.  History of Present Illness    ROS  ***     Objective   There were no vitals taken for this visit.  Physical Exam  GAD score  Depression score  Physical Exam    Results            Assessment & Plan      {Time Based Attestation and Data Reviewed (Optional):[id number]}           Author: Dolphus Jenny, NP  Note signed: 07/19/2023

## 2023-07-20 ENCOUNTER — Other Ambulatory Visit: Payer: Self-pay | Admitting: Primary Care

## 2023-07-20 ENCOUNTER — Ambulatory Visit: Payer: Self-pay | Admitting: Primary Care

## 2023-07-23 ENCOUNTER — Other Ambulatory Visit: Payer: Self-pay

## 2023-07-23 ENCOUNTER — Ambulatory Visit: Payer: No Typology Code available for payment source | Attending: Primary Care | Admitting: Primary Care

## 2023-07-23 VITALS — BP 110/70 | HR 92 | Temp 97.3°F | Resp 16 | Ht 64.0 in | Wt 170.9 lb

## 2023-07-23 DIAGNOSIS — Z8669 Personal history of other diseases of the nervous system and sense organs: Secondary | ICD-10-CM

## 2023-07-23 DIAGNOSIS — R000 Tachycardia, unspecified: Secondary | ICD-10-CM

## 2023-07-23 DIAGNOSIS — F32A Depression, unspecified: Secondary | ICD-10-CM

## 2023-07-23 DIAGNOSIS — R1310 Dysphagia, unspecified: Secondary | ICD-10-CM

## 2023-07-23 DIAGNOSIS — R42 Dizziness and giddiness: Secondary | ICD-10-CM

## 2023-07-23 DIAGNOSIS — R519 Headache, unspecified: Secondary | ICD-10-CM

## 2023-07-23 DIAGNOSIS — F419 Anxiety disorder, unspecified: Secondary | ICD-10-CM

## 2023-07-23 DIAGNOSIS — G8929 Other chronic pain: Secondary | ICD-10-CM

## 2023-07-23 NOTE — Progress Notes (Unsigned)
Caroline Blanchard is a 32 y.o. female. (July 05, 1991)      increased anxiety and discuss POTS syndrom        Visit Vitals  BP 110/70   Pulse 92   Temp 36.3 C (97.3 F)   Resp 16   Ht 1.626 m (5\' 4" )   Wt 77.5 kg (170 lb 14.4 oz)   SpO2 98%   BMI 29.33 kg/m       CC:Worsening anxiety and depression    HPI: Caroline Blanchard is a 32 year old Caucasian lady who presented today with a concern regarding worsening anxiety and depression.  On November 18 she was evaluated for similar concerns plus dizziness and panic attacks.  She sees a psychiatrist and she is on Lamictal for past nearly 1 year.  Says recently her Lamictal dose was increased from 100  - 150 mg daily and since the dose was increased she was feeling frequent dizziness therefore it was thought that it could be related to high dose of Lamictal use versus recent COVID infection versus worsening anxiety.  Therefore her Lamictal was cut down from 1 50-75.  Says since this Lamictal has been decreased she is in a mass says her panic attacks anxiety attacks have been worse and she is still feeling on and off chest tightness.  She is crying a lot and feeling miserable.  Says she was given Xanax to calm herself down.  She says it does work very well to calm her down but it makes her more suicidal after few hours.  Says she took 2 or 3 doses so far and each time she felt that way and therefore she cannot continue using Xanax.    Per her report she is also having some swallowing issue.  Says she can swallow soft food without any issues but struggling with any solid food.  She says as she is having difficulty with her solid food she is avoiding any such food as a result her oral intake has reduced.  She says she has lost some weight.  Says she has been trying her best to drink a lot of fluid.  Per her report she is also slightly constipated and she was trying to strain her stool.  She says she is not eating much to have much stool but still she thought her stool is quite hard and  she was straining and in that process she said she noticed small amount of blood in her stool.  Since then she has been trying to drink more fluid to fight off this constipation.      Per her report her psychiatrist have advised her to rule out POTS syndrome as she thinks that her dizziness could be related to that.  Says she is reluctant to go on a higher dose again due to this increased dizziness.  She says her dizziness has definitely improved since the dose has been reduced to 75 but it is not working out for her anxiety or depression.  She says she has spoken to her psychiatrist and she is ready to increase her Lamictal to at least 100 mg daily.  Per Caroline Blanchard's report her her heart rate ranges between 70 to 148.  Says in the last 24 hours her heart rate went to 148 twice and both time she was very anxious.  Says otherwise generally her heart rate stays between 70 and 90 beat per minute.     Generalized Anxiety Disorder Screener (GAD-7)         Not  at all-0  Several Days - 1  More than half the days -- 2  Nearly every day-- 3        1. Feeling nervous, anxious or on edge  3  2. Not being able to stop or control worrying 3  3. Worrying too much about different things 3  4. Trouble relaxing Over the last 2 weeks, how often have you been bothered by the following problems?3  5. Being so restless that it is hard to sit still 0  6. Becoming easily annoyed or irritated 2  7. Feeling afraid as if something awful might happen 3  8. If you checked off any problems, how difficult have these problems made it for you to do your work, take care of things at home, or get along with other people?3        Total score 20     Screening for depression:------     Feeling sad most of the day: 3  Feeling worthlessness and guilt or hopelessness:0  Low energy or fatigue: 3 .  Difficulty concentrating:  2  Loss of interest daily activities: 3  Irritability and restlessness:  3.  Trouble sleeping or sleeping too much: 3  Significant weight  change:0     Thought of suicide:  0 .  Total score 17  ROS:  Denies any fever or chills.  Denies any sore throat runny nose or increase sinus pressure.  Denies any chest pain but does admit to have some on and off chest tightness with her panic attacks.  Denies any problem with breathing or cough.  Denies any nausea, vomiting, diarrhea but does admit to have mild constipation.  Denies any abdominal pain but does have some abdominal uneasiness with anxiety.  Denies any problem with urination.    Allergies   Allergen Reactions    Seasonal Allergies Rhinitis      congesttion         Current Outpatient Medications   Medication Sig Dispense Refill    propranolol 10 mg tablet Take 1 tablet (10 mg total) by mouth daily. (Patient not taking: Reported on 07/23/2023)      rizatriptan (MAXALT-MLT) 5 MG disintegrating tablet Take 1 tablet (5 mg total) by mouth as needed for Migraine. May repeat in 2 hours if needed      NURTEC 75 MG disintegrating tablet Take by mouth.      ondansetron 4 MG disintegrating tablet Take by mouth.      magnesium oxide (MAG-OX) 400 (241.3 mg) mg tablet Take 1 tablet (400 mg total) by mouth daily.      Riboflavin (VITAMIN B2 PO) Take by mouth.      GENERIC EXTERNAL MEDICATION Stress calm      GENERIC EXTERNAL MEDICATION Arnica      Ginger, Zingiber officinalis, (GINGER PO) Take by mouth.      calcium carbonate (TUMS EX) 750 MG chewable tablet Place 1 tablet (750 mg total) into mouth, chew and swallow daily.      cyclobenzaprine (FLEXERIL) 5 mg tablet Take 1-2 tablets (5-10 mg total) by mouth 3 times daily as needed. 45 tablet 1    lamoTRIgine (LAMICTAL) 150 mg tablet Take 1 tablet (150 mg total) by mouth daily. (Patient taking differently: Take 75 mg by mouth daily.) 30 tablet 0    carboxymethylcellulose-glycerin (REFRESH OPTIVE) 0.5-0.9 % SOLN ophthalmic solution Place 2 drops into both eyes daily as needed (dry red eyes).      EPINEPHrine (EPIPEN) 0.3 mg/0.3 mL  auto-injector Inject 0.3 mLs (0.3 mg  total) into the muscle as needed (difficulty swallowing).      ketoconazole (NIZORAL) 2 % shampoo Apply topically daily.      ALPRAZolam 0.25 mg tablet Take 1 tablet (0.25 mg total) by mouth daily as needed.       No current facility-administered medications for this visit.         Past Medical History:   Diagnosis Date    Anemia     Anxiety     Depression     Head injury     Headache     migraines    Panic attack     Sleep difficulties          No past surgical history on file.      Social History     Tobacco Use    Smoking status: Never     Passive exposure: Never    Smokeless tobacco: Never   Vaping Use    Vaping status: Never Used   Substance Use Topics    Alcohol use: Not Currently     Comment: LU 6 months ago    Drug use: Never         Physical Exam:   No acute distress.  Eyes: Clear conjunctiva.  No mucous discharge.  PERRLA  Ears: Normal pinnae.  Acuity  to conversational tones is good.    Both canals are clear.  Tympanic membranes appear gray and pearly.  Nose: Pink and moist mucosa.  No sinus tenderness.  Throat: Pink and moist mucosa, no exudate or postnasal drip.  Neck:  Trachea midline.  No thyromegaly.  No visible or palpable masses noted.  No cervical or supraclavicular lymphadenopathy  Chest: Clear to auscultate bilaterally, with good air movement and no wheezes or rales.  No accessory muscle usage.  Respirations unlabored.   Heart: S1 and S2 regular rate and rhythm.  No murmur or gallop. Blood pressure recheck, lying 120/82, heart rate 72.  Sitting position 120/78 and heart rate 77.  Standing blood pressure 110/80 and heart rate 87.   Abdomen: Soft, nontender and normoactive bowel sounds.  No overt hepatosplenomegaly  Lower extremities: No clubbing, cyanosis or edema.  No ulcerations or any increased warmth noted.  Psych: Sad appearance and crying most of the time during office visit and conversations.    Assessment and Plan:        1. Anxiety and depression  Worsening anxiety and depression.  She  was advised to speak with her psychiatrist for medication adjustment.  Advised her to speak with the psychiatrist about her anxiety and if Xanax is not working then she probably will prescribe her something else.  Encouraged her to speak with a psychiatrist at her earliest convenience.  She was counseled regarding anxiety and depression.  Currently she is living by herself.  Advised her to seek for some help from family and friends and stay with her family at least until she feels stable.  Suggested her to move back with the parents so she can have some emotional support.  Advised her to call in case she starts to feel any worse or starts to feel suicidal.  Advised her to call 1 with any emergency.  She does have some support from family.  Encouraged her to call us with any worsening symptoms.  - [facility] REFERR[facility]    2. Dizziness  Her dizziness has improved in the last 1 week although it has  not completely gone but much better than before.  We will refer her to a cardiologist for full workup and to rule out POTS syndrome.  We will do a full set of labs to rule out any other underlying pathophysiology.  - CBC and differential; Future  - Comprehensive metabolic panel; Future  - TSH; Future  - Vitamin D; Future  - Vitamin B12; Future  - Sedimentation rate, automated; Future  - C reactive protein; Future  - [facility] REFERRAL [facility]    3. Chronic headaches  She does get chronic tension headaches and since she started using Flexeril she feels that the headaches have improved significantly.  Advised her to continue with Flexeril 5 mg 3 times daily as needed.  Her psychiatrist has also advised her to start propranolol 10 mg daily for depression and chronic headaches but she has not started it yet  - CBC and differential; Future  - Comprehensive metabolic panel; Future  - TSH; Future  - Vitamin D; Future  - Vitamin B12; Future  - Sedimentation rate, automated; Future  - C  reactive protein; Future  - [facility]B REFERRAL [facility]    4. History of migraine headaches  She used to take Nurtec 75 mg as needed for her migraine headaches but lately she is having some difficulty with supply.  Currently she is under a neurologist treatment for her migraine headaches and recently received new prescription for rizatriptan for her migraine headaches.  She is planning to start propranolol 10 mg daily dose for her depression/anxiety which will also help with her migraine headaches and palpitations.  - CBC and differential; Future  - Comprehensive metabolic panel; Future    5. Tachycardia  She has been feeling some palpitations and her psychiatrist is suggesting to get POTS syndrome ruled out.  We will refer her to a cardiologist for further workup  - [facility] REFERRAL [facility]    6. Dysphagia, unspecified type  She has been experiencing dysphagia for past many weeks.  We will refer her to our speech therapist for swallow eval.  - AMB REFERRAL TO SPEECH THERAPY       Follow up in about 4 weeks (around 08/20/2023).

## 2023-07-24 ENCOUNTER — Telehealth: Payer: Self-pay

## 2023-07-24 ENCOUNTER — Encounter: Payer: Self-pay | Admitting: Primary Care

## 2023-07-24 DIAGNOSIS — R1310 Dysphagia, unspecified: Secondary | ICD-10-CM | POA: Insufficient documentation

## 2023-07-24 DIAGNOSIS — R42 Dizziness and giddiness: Secondary | ICD-10-CM | POA: Insufficient documentation

## 2023-07-24 NOTE — Telephone Encounter (Signed)
Pt scheduled  

## 2023-07-24 NOTE — Telephone Encounter (Signed)
LMOM to schedule NPV with any provider, see referral

## 2023-07-24 NOTE — Progress Notes (Signed)
Adult Partial Hospitalization Program  Clinical Management Note     Caroline Blanchard  [id number]  07/24/2023  Referral: Adult Partial Hospitalization   Date: 07-24-2023     Reviewed The Role of the Adult Partial Hospitalization Program Caroline Blanchard   Reviewed Requirements to Refer to [facility] Caroline Blanchard   Referring Person     Referring Name Caroline Blanchard, PMHNP   Referring Phone [phone]   Referring Email [email or web address]   Fax # to send discharge summary to [phone]   Referring Agency/Program Other (Please Specify)   Other Agency Caroline Doe, NP in Psychiatry [facility]   Upload Clinical [facility] referral must be accompanied by a current clinical summary.  [[email or web address]]   Patient Information     Last Name [facility]   First Name Caroline Blanchard   Date of Birth 1991/02/05   Address [address] [place], [place], Zip Code [place] [address]   Phone [phone]   Insurance Provider [employer] [place]   Insurance Number [id number]   PatientBlanchard Chief Complaint Depression   ReferrerBlanchard reason for referral and goal of PHF admission Decompensation due to external stressors.    Current Diagnostic Impression F33.1 Major Depressive Disorder F41.1 Generalized Anxiety Disorder   Risk Factors     Suicidal ideation  Current  Passive, no plan or intent.    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    Psychosocial Stressors Poor social and emotional support, employment at risk due to poor attendance.    Potential barriers to PHP treatment None    Alcohol & Substance Use      Alcohol Use     Ability to stop/motivation to address in treatment?    Alcohol: Date of Last Use  Alcohol: Most recent amount, frequency, duration    Substance Use       Substance Ability to stop/motivation to address in treatment?    Name Substance    Substance: Date of Last Use    Substance: Most recent amount, frequency, duration    2. Name Substance    2. Substance: Date of Last Use    2. Substance: Most recent amount, frequency, duration    3. Name Substance    3. Substance: Date of Last Use    3. Substance: Most recent amount, frequency, duration    4. Name Substance    4. Substance: Date of Last Use    4. Substance: Most recent amount, frequency, duration    Medications     1. Name of Medication lamotrigine    Dose/Frequency and Duration 100mg    2. Name of Medication hydroxyzine    Dose/Frequency and Duration 10mg     3. Name of Medication    Dose/Frequency & Duration    4. Name of Medication    Dose/Frequency & Duration    Past MedicationsPlease enter name of  medication, dose/frequency & reason for discontinuation.  Trazodone (ineffective), gabapentin (drowsy), hydroxyzine (poor efficacy), Wellbutrin (anxiety), duloxetine, Abilify (weight gain), Prozac, Zoloft   Allergies/Adverse Drug Reactions NKDA   Are there any recommendations from the patientBlanchard prescribing provider for medication changes while in PHP? None.    Current Treatment Providers     Psychiatrist Caroline Blanchard, PMHNP-BC   Psychiatrist Phone [phone]   Therapist    Therapist Phone    Care Manager    Care Manager Phone    Primary Care Doctor    Primary Care Doctor Phone    Complete     Complete? Complete      Date: July 24, 2023   To Whom It May Concern,   I am writing to formally request the re-admission of Caroline Blanchard to the Partial Hospitalization Program (PHP) at [facility]. Caroline Blanchard was recently discharged from the program; however, due to a significant decompensation in their mental health status, more intensive support is  necessary at this time.   Caroline Blanchard has a current diagnosis of Major Depressive Disorder, recurrent, moderate (F33.1), and Generalized Anxiety Disorder (F41.1). Since their discharge, they have experienced a decline in their ability to function effectively in daily life. Specifically, they have been unable to attend work, participate in activities of daily living (ADLs), and apply coping skills learned previously. Additionally, they are experiencing daily panic attacks, which further impede their stability and well-being.   The current medication regimen includes Lamotrigine 100mg  daily and Hydroxyzine 10mg  as needed, which has not been sufficient in managing the symptoms effectively. Given the severity of the symptoms and the impact on Caroline Blanchard quality of life, it is imperative to provide them with the structured and therapeutic environment that a partial hospitalization program can offer.   Re-admission to the PHP would allow Caroline Blanchard to receive the comprehensive treatment and support required to stabilize their condition and develop sustainable coping mechanisms. The multidisciplinary approach of the program will be instrumental in addressing both the depressive and anxiety symptoms that are currently unmanageable on an outpatient basis.   Please consider this request for re-admission to the Partial Hospitalization Program as a matter of urgency. I am confident that with the appropriate level of care, Caroline Blanchard will be able to make significant progress towards recovery.   Thank you for your attention to this matter. Please feel free to contact me directly at [phone] or via fax at [phone] should you require any further information or wish to discuss this case in more detail.   Sincerely,   Caroline Hacker, NP Psychiatric-Mental Health Nurse Practitioner [address] [place], [place], [address] Phone: [phone] Fax: [phone]

## 2023-07-25 ENCOUNTER — Telehealth: Payer: Self-pay | Admitting: Primary Care

## 2023-07-25 ENCOUNTER — Encounter: Payer: Self-pay | Admitting: Primary Care

## 2023-07-25 ENCOUNTER — Other Ambulatory Visit
Admission: RE | Admit: 2023-07-25 | Discharge: 2023-07-25 | Disposition: A | Discharge status: Home / Self Care | Payer: PRIVATE HEALTH INSURANCE | Source: Ambulatory Visit | Attending: Primary Care | Admitting: Primary Care

## 2023-07-25 DIAGNOSIS — F32A Depression, unspecified: Secondary | ICD-10-CM | POA: Insufficient documentation

## 2023-07-25 DIAGNOSIS — R42 Dizziness and giddiness: Secondary | ICD-10-CM | POA: Insufficient documentation

## 2023-07-25 DIAGNOSIS — Z8669 Personal history of other diseases of the nervous system and sense organs: Secondary | ICD-10-CM | POA: Insufficient documentation

## 2023-07-25 DIAGNOSIS — Z0000 Encounter for general adult medical examination without abnormal findings: Secondary | ICD-10-CM | POA: Insufficient documentation

## 2023-07-25 DIAGNOSIS — R519 Headache, unspecified: Secondary | ICD-10-CM | POA: Insufficient documentation

## 2023-07-25 DIAGNOSIS — G8929 Other chronic pain: Secondary | ICD-10-CM | POA: Insufficient documentation

## 2023-07-25 DIAGNOSIS — F419 Anxiety disorder, unspecified: Secondary | ICD-10-CM | POA: Insufficient documentation

## 2023-07-25 LAB — CBC AND DIFFERENTIAL
Baso # K/uL: 0 10*3/uL (ref 0.0–0.2)
Eos # K/uL: 0.1 10*3/uL (ref 0.0–0.5)
Hematocrit: 45 % (ref 34–49)
Hemoglobin: 14.9 g/dL (ref 11.2–16.0)
IMM Granulocytes #: 0 10*3/uL (ref 0.0–0.0)
IMM Granulocytes: 0.3 %
Lymph # K/uL: 2.7 10*3/uL (ref 1.0–5.0)
MCV: 90 fL (ref 75–100)
Mono # K/uL: 0.6 10*3/uL (ref 0.1–1.0)
Neut # K/uL: 4.4 10*3/uL (ref 1.5–6.5)
Nucl RBC # K/uL: 0 10*3/uL (ref 0.0–0.0)
Nucl RBC %: 0 /100{WBCs} (ref 0.0–0.2)
Platelets: 249 10*3/uL (ref 150–450)
RBC: 5 MIL/uL (ref 4.0–5.5)
RDW: 12.5 % (ref 0.0–15.0)
Seg Neut %: 55.6 %
WBC: 7.9 10*3/uL (ref 3.5–11.0)

## 2023-07-25 LAB — COMPREHENSIVE METABOLIC PANEL
ALT: 14 U/L (ref 0–35)
AST: 17 U/L (ref 0–35)
Albumin: 4.7 g/dL (ref 3.5–5.2)
Alk Phos: 61 U/L (ref 35–105)
Anion Gap: 11 (ref 7–16)
Bilirubin,Total: 0.7 mg/dL (ref 0.0–1.2)
CO2: 26 mmol/L (ref 20–28)
Calcium: 9.7 mg/dL (ref 8.8–10.2)
Chloride: 100 mmol/L (ref 96–108)
Creatinine: 0.78 mg/dL (ref 0.51–0.95)
Glucose: 77 mg/dL (ref 60–99)
Lab: 8 mg/dL (ref 6–20)
Potassium: 4.3 mmol/L (ref 3.3–5.1)
Sodium: 137 mmol/L (ref 133–145)
Total Protein: 6.9 g/dL (ref 6.3–7.7)
eGFR BY CREAT: 104 *

## 2023-07-25 LAB — SEDIMENTATION RATE, AUTOMATED: Sedimentation Rate: 6 mm/h (ref 0–20)

## 2023-07-25 LAB — VITAMIN B12: Vitamin B12: 876 pg/mL (ref 232–1245)

## 2023-07-25 LAB — CORTISOL: CORTISOL,AM: 9.8 ug/dL (ref 6.0–18.4)

## 2023-07-25 LAB — CRP: CRP: <3 mg/L (ref 0–8)

## 2023-07-25 LAB — VITAMIN D: 25-OH Vit Total: 30 ng/mL (ref 30–60)

## 2023-07-25 LAB — TSH: TSH: 1.91 u[IU]/mL (ref 0.27–4.20)

## 2023-07-25 NOTE — Telephone Encounter (Signed)
Spoke to psych NP Asia Todd regarding [employer].  Per her report  Caroline Blanchard requested FMLA papers to be filled out.  She thinks that Rolly Salter should be working full-time and not taking time off as it can aggravate her anxiety.  She feels that the work will give her good distraction and keep her mind busy.  FMLA leave will be given to her only for medical appointments.

## 2023-07-30 ENCOUNTER — Ambulatory Visit: Payer: Self-pay | Admitting: Primary Care

## 2023-07-30 ENCOUNTER — Ambulatory Visit: Payer: PRIVATE HEALTH INSURANCE | Attending: Primary Care | Admitting: Primary Care

## 2023-07-30 DIAGNOSIS — F32A Depression, unspecified: Secondary | ICD-10-CM

## 2023-07-30 DIAGNOSIS — F419 Anxiety disorder, unspecified: Secondary | ICD-10-CM

## 2023-07-30 NOTE — Telephone Encounter (Signed)
Had a video visit with her today she is doing much better and ready to go back to work on 4 December.

## 2023-07-30 NOTE — Progress Notes (Unsigned)
Caroline Blanchard is a 32 y.o. female. (1991/03/26)      No chief complaint on file.        There were no vitals taken for this visit.    CC: This follow-up of anxiety    HPI: This is a video visit for Caroline Blanchard.  Per her report she has been feeling slightly better than before her anxiety and depression has slightly under better control.  She has been consulting her psychiatrist who has recently increased her dose of Lamictal from 75 to 100 mg daily.  She feels that it has made a lot of difference in her anxiety.  She was also started on propranolol 10 mg daily which has calm her nerves a little bit.  She says at least now she can think straight and focus on things before she was just not able to focus on anything as she was just very too anxious.  She says she is now coming back to a level where she can think of going back to work.  She says she is planning to start working from December 4 as per her schedule.    Says she would like [place] to fill out some form to have some time off so she can go and visit her doctors as she has been planning to see multiple different providers.  She says the first 1 is her speech pathologist for swallowing well then she has to see her cardiologist for her palpitations and she also needs to follow-up with [place] and her psychiatrist.  Therefore she is requesting some time off from her work to manage these visits.  She says she only needs a few hours of in a week maybe 5 to 7 hours off in a week to handle these appointments.  Most of the appointments are in December after that her appointments will be less frequent.    Apart from anxiety and depression she was also experiencing some dizziness muscle spasm, nausea and vomiting.  Which could be related to her anxiety and depression or something separate.  For her dysphagia she is going to have her swallow eval done soon.  She also suffers from migraine headaches and has been following up with her neurologist.  Says the neurologist is planning to  get an MRI done for her.  She says she needs some time off to get this MRI done as well.    ROS:  She denies any fever or chills.  She denies any sore throat runny nose or increase sinus pressure.  Denies any chest pain today that she has any difficulty with her breathing.  She does admit to have occasional nausea but lately she has not vomited.  Says Zofran 4 mg once or twice per day as needed basis is working well for her .   Allergies   Allergen Reactions    Seasonal Allergies Rhinitis      congesttion         Current Outpatient Medications   Medication Sig Dispense Refill    propranolol 10 mg tablet Take 1 tablet (10 mg total) by mouth daily. (Patient not taking: Reported on 07/23/2023)      rizatriptan (MAXALT-MLT) 5 MG disintegrating tablet Take 1 tablet (5 mg total) by mouth as needed for Migraine. May repeat in 2 hours if needed      NURTEC 75 MG disintegrating tablet Take by mouth.      ondansetron 4 MG disintegrating tablet Take by mouth.      magnesium  oxide (MAG-OX) 400 (241.3 mg) mg tablet Take 1 tablet (400 mg total) by mouth daily.      Riboflavin (VITAMIN B2 PO) Take by mouth.      GENERIC EXTERNAL MEDICATION Stress calm      GENERIC EXTERNAL MEDICATION Arnica      Ginger, Zingiber officinalis, (GINGER PO) Take by mouth.      calcium carbonate (TUMS EX) 750 MG chewable tablet Place 1 tablet (750 mg total) into mouth, chew and swallow daily.      cyclobenzaprine (FLEXERIL) 5 mg tablet Take 1-2 tablets (5-10 mg total) by mouth 3 times daily as needed. 45 tablet 1    lamoTRIgine (LAMICTAL) 150 mg tablet Take 1 tablet (150 mg total) by mouth daily. (Patient taking differently: Take 75 mg by mouth daily.) 30 tablet 0    carboxymethylcellulose-glycerin (REFRESH OPTIVE) 0.5-0.9 % SOLN ophthalmic solution Place 2 drops into both eyes daily as needed (dry red eyes).      EPINEPHrine (EPIPEN) 0.3 mg/0.3 mL auto-injector Inject 0.3 mLs (0.3 mg total) into the muscle as needed (difficulty swallowing).       ketoconazole (NIZORAL) 2 % shampoo Apply topically daily.      ALPRAZolam 0.25 mg tablet Take 1 tablet (0.25 mg total) by mouth daily as needed.       No current facility-administered medications for this visit.         Past Medical History:   Diagnosis Date    Anemia     Anxiety     Depression     Head injury     Headache     migraines    Panic attack     Sleep difficulties          No past surgical history on file.      Social History     Tobacco Use    Smoking status: Never     Passive exposure: Never    Smokeless tobacco: Never   Vaping Use    Vaping status: Never Used   Substance Use Topics    Alcohol use: Not Currently     Comment: LU 6 months ago    Drug use: Never         Physical Exam:   She appears much more calmer.  No signs of panic attacks noted .  She is answering all questions appropriately    Assessment and Plan:        1. Anxiety and depression  Improving anxiety and depression.  Advised her to follow-up with her psychiatrist and continue with Lamictal as prescribed.  She was also advised to continue with Xanax and propranolol as prescribed.  Paperwork for her time off will be filled out.       Follow up in about 4 weeks (around 08/27/2023).

## 2023-08-01 ENCOUNTER — Encounter: Payer: Self-pay | Admitting: Primary Care

## 2023-08-01 ENCOUNTER — Telehealth: Payer: Self-pay

## 2023-08-01 NOTE — Telephone Encounter (Signed)
Called and spoke to patient to schedule NPV.   Patient preferred to be seen at [facility]

## 2023-08-01 NOTE — Progress Notes (Signed)
Adult Partial Hospitalization Program  Clinical Management Note     Caroline Blanchard  [id number]  08/01/2023    Triage Coordinator Note:  Phone call received from patient.  She requested to cancel her [facility] intake for 12/5 at 10:30 AM.  She reports that she cannot take time off from work to be able to do the program.  She will continue in outpatient care.  Intake appointment has been canceled and referral closed.

## 2023-08-02 ENCOUNTER — Other Ambulatory Visit: Payer: Self-pay

## 2023-08-02 ENCOUNTER — Ambulatory Visit: Payer: No Typology Code available for payment source

## 2023-08-02 ENCOUNTER — Ambulatory Visit: Payer: PRIVATE HEALTH INSURANCE | Attending: Primary Care

## 2023-08-02 DIAGNOSIS — R1311 Dysphagia, oral phase: Secondary | ICD-10-CM | POA: Insufficient documentation

## 2023-08-02 DIAGNOSIS — K219 Gastro-esophageal reflux disease without esophagitis: Secondary | ICD-10-CM | POA: Insufficient documentation

## 2023-08-02 NOTE — Progress Notes (Signed)
CLINICAL EVALUATION OF SWALLOWING    Patient Name: Caroline Blanchard  Date of Exam: 08/02/2023  Date of Birth: [birth year]  Patient MRN: [id number]  Referring Physician: Frances Maywood, NP  Diagnosis:  Oral dysphagia    Presenting Complaint:    History of Complaint:    Patient seen today for swallow evaluation due to difficulty swallowing and fear of swallowing.  She reports having an episode back in May where she felt like her tongue swelled and she had trouble swallowing.  She then had a second episode where she choked but she was able to get the food out herself.  She underwent allergy testing and was found to not have any allergies.  She reports that over the past month she has felt soreness in her throat and cannot swallow anything unless it is soft and easy to chew, small bites.  It takes her a long time to eat now.  She endorses being anxious when eating and afraid because she is home alone if she chokes again.  She reports a lifelong difficulty of swallowing pills.  She also endorses having throat and chest pains.  She also had COVID in August, and reports that she has not felt normal since then.  Patient states her behavioral health provider has concerns that she may have POTS.  She reports having frequent heartburn, even when eating more bland food.  She is managing it primarily with Tums and ginger.  She reports having lost 6 pounds in the past week.  She also reports significant TMJ symptoms, including cracking teeth.  She describes her throat is feeling small, and when she swallows she feels things get stuck and gags.  She tries to use water to help her food get down.  She is primarily been eating smooth protein bars, yogurt, soft cheese, soft bread, soft chicken nuggets, soups, and hot tea.  She reports feeling frequent throat clearing and postnasal drip.    Past Medical History:   Diagnosis Date    Anemia     Anxiety     Depression     Head injury     Headache     migraines    Panic attack     Sleep  difficulties        Current Outpatient Medications   Medication Sig    propranolol 10 mg tablet Take 1 tablet (10 mg total) by mouth daily. (Patient not taking: Take 1 tablet (10 mg total) by mouth daily.)    rizatriptan (MAXALT-MLT) 5 MG disintegrating tablet Take 1 tablet (5 mg total) by mouth as needed for Migraine. May repeat in 2 hours if needed    NURTEC 75 MG disintegrating tablet Take by mouth.    ondansetron 4 MG disintegrating tablet Take by mouth.    magnesium oxide (MAG-OX) 400 (241.3 mg) mg tablet Take 1 tablet (400 mg total) by mouth daily.    Riboflavin (VITAMIN B2 PO) Take by mouth.    GENERIC EXTERNAL MEDICATION Stress calm    GENERIC EXTERNAL MEDICATION Arnica    Ginger, Zingiber officinalis, (GINGER PO) Take by mouth.    calcium carbonate (TUMS EX) 750 MG chewable tablet Place 1 tablet (750 mg total) into mouth, chew and swallow daily.    cyclobenzaprine (FLEXERIL) 5 mg tablet Take 1-2 tablets (5-10 mg total) by mouth 3 times daily as needed.    lamoTRIgine (LAMICTAL) 150 mg tablet Take 1 tablet (150 mg total) by mouth daily. (Patient taking differently: Take 75 mg by mouth daily.)  carboxymethylcellulose-glycerin (REFRESH OPTIVE) 0.5-0.9 % SOLN ophthalmic solution Place 2 drops into both eyes daily as needed (dry red eyes).    EPINEPHrine (EPIPEN) 0.3 mg/0.3 mL auto-injector Inject 0.3 mLs (0.3 mg total) into the muscle as needed (difficulty swallowing).    ketoconazole (NIZORAL) 2 % shampoo Apply topically daily.    ALPRAZolam 0.25 mg tablet Take 1 tablet (0.25 mg total) by mouth daily as needed.         Oral Motor Exam :  Labial /Lips   ROM: within functional limits   Strength: within functional limits  Facial   Symmetry: within functional limits   Strength: within functional limits   Sensation: within functional limits  Lingual / Tongue   ROM: within functional limits   Symmetry: within functional limits   Strength: within functional limits   Coordination: within functional  limits  Velum:symmetrical rise  Mandible: within functional limits  Dentition: intact  Hearing: within functional limits  Communication Status:normal  Intelligibility: within normal limits  Vocal quality: clear    ASSESSMENT / IMPRESSION  Rossanna Kadrmas exhibits signs/symptoms of oral dysphagia/aversion, characterized by difficulty swallowing and gagging on foods in the oral phase following several recent patient reported choking incidents.  Her symptoms are most consistent with possible pharyngeal and esophageal irritation from uncontrolled reflux, which may have led to initial episodes and have contributed to her oral phase issues.  Informal oral/pharyngeal and communication assessments are all within normal limits.  She may benefit from additional reflux treatment and will refer back to her PCP for guidance.  If necessary, she may also benefit in the future from treatment of TMJ symptoms, which may be contributing to muscle tension and difficulties of swallow function.  Lastly, results of today's evaluation are limited due to inability to visualize swallow.  She would also benefit from instrumental swallow evaluation in the future.    RECOMMENDATION / PLAN  Diet: regular solid, thin liquids - encouraged to eat any foods that she can tolerate  2.   Strategies: small bites and sips, slow pace, mindful eating strategies (monitor body tension, breathing), favor easier to swallow foods and progress textures as tolerated (soft, moist)  3.   Speech Pathology Services to provide swallow therapy and additional testing if required.  4.   Return to referring provider to discuss reflux symptoms and appropriate additional treatment.      SHORT TERM GOALS  1.  Patient will tolerate least restrictive diet with minimized risk of aspiration/penetration.  2.  Patient will follow swallow precautions at least 90% of the time.  3.  Patient will complete oropharyngeal exercise routine three times daily.    LONG TERM GOALS   1.   Patient will tolerate least restrictive diet with minimized risk of aspiration/penetration.      If you have any questions or concerns regarding this evaluation, please do not hesitate to call me at [phone].  Thank you for your referral.    INTERVENTION PROVIDED    Bethann Punches, M.M., M.S., CCC-SLP  Speech-Language Pathologist

## 2023-08-03 ENCOUNTER — Ambulatory Visit: Payer: PRIVATE HEALTH INSURANCE | Attending: Neurology | Admitting: Otolaryngology

## 2023-08-03 ENCOUNTER — Ambulatory Visit: Payer: PRIVATE HEALTH INSURANCE

## 2023-08-03 ENCOUNTER — Encounter: Payer: Self-pay | Admitting: Otolaryngology

## 2023-08-03 VITALS — BP 113/72 | HR 69 | Temp 98.1°F | Ht 64.0 in | Wt 169.0 lb

## 2023-08-03 DIAGNOSIS — H9191 Unspecified hearing loss, right ear: Secondary | ICD-10-CM | POA: Insufficient documentation

## 2023-08-03 DIAGNOSIS — G43809 Other migraine, not intractable, without status migrainosus: Secondary | ICD-10-CM

## 2023-08-03 DIAGNOSIS — R42 Dizziness and giddiness: Secondary | ICD-10-CM

## 2023-08-03 DIAGNOSIS — M26629 Arthralgia of temporomandibular joint, unspecified side: Secondary | ICD-10-CM

## 2023-08-03 NOTE — Progress Notes (Signed)
AUDIOLOGIC EVALUATION     [facility]  Audiology   [address] [place], [address]  [place],  [place] [address]  Phone: [phone], Fax: [phone]     Outpatient Visit  Patient: Caroline Blanchard   MR Number: [id number]   Date of Birth: [birth year]   Date of Visit: 08/03/2023      PURE-TONE TEST RESULTS  Type of Testing: conventional      Test Reliability: good  Transducer: insert  Booth: 5/3  ANSI S3.21.2004 (R2009)     Air Conduction Testing (dB HL and kHz)     LEFT EAR RIGHT EAR     0.125 0.25 0.50  0.75 1.0 1.5 2.0 3.0 4.0 6.0 8.0  0.125 0.25 0.50 0.75 1.0 1.5 2.0 3.0 4.0 6.0 8.0     5 5   5   10   5   5      10 5   10   5   10   5                                                                      Bone Conduction Testing (dB HL and kHz)  Bone conduction was unmasked/unspecified      0.25 0.50  0.75 1.0 1.5 2.0 3.0 4.0     0.25 0.50 0.75 1.0 1.5 2.0 3.0 4.0      10 5   5   5   5                                                                                           SPEECH AUDIOMETRY     SAT SRT Score dB HL EML  Test  SAT SRT Score dB HL EML  Test       100 % 45   W-22 CD      100 % 45   W-22 CD   EML   EML            EML   EML               Please refer to scanned [facility] [id number] MR for additional information     Notes  Threshold in dB HL  Frequency in kiloHertz (kHz) Legend   dB=decibels  HL=Hearing Level  NR=No Response  VT=Vibro-Tactile EML=Effective Masking Level  SAT=Speech Awareness Threshold  SRT=Speech Reception  Threshold  MLV=Monitored Live Voice  CD=Compact Disk     HISTORY: Keshawn Dahan was referred by Dr. Neil Crouch for an audiological evaluation. The patient reported episodes of severe vertigo since having COVID in August 2024. She reported intermittent ringing tinnitus, right ear, for the past month and a half and aural fullness/tightness, right ear, for the past 2 months. Concerns regarding hearing denied.    FINDINGS: Pure-tone test results indicate normal hearing sensitivity, bilaterally.  Speech recognition ability in quiet was excellent when  presented at soft conversational speech levels, bilaterally.    TYMPANOMETRY:  Left Ear: Tested Right Ear: Tested   Frequency: 226 Hz Frequency: 226 Hz    Canal Volume (ml): 1.3 Canal Volume (ml): 1.1   Static Compliance Peak (ml): 0.44 Static Compliance Peak (ml): 0.91   Peak Pressure (daPa): -2 Peak Pressure (daPa): 0   Gradient (daPa): 85 Gradient (daPa): 60   These results indicate normal ear canal volume, compliance, and pressure, consistent with normal Eustachian Tube function, bilaterally.    Today's results were discussed with the patient. Patient returned to Dr. Neil Crouch in [facility] Otol[facility] immediately following today's evaluation.    RECOMMENDATIONS: Audiological re-evaluation per Dr. Neil Crouch.      Gerilyn Nestle, Au.D., CCC-A  Audiologist  [facility]iology
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
## 2023-08-07 ENCOUNTER — Ambulatory Visit
Admission: RE | Admit: 2023-08-07 | Discharge: 2023-08-07 | Disposition: A | Discharge status: Home / Self Care | Payer: PRIVATE HEALTH INSURANCE | Source: Ambulatory Visit

## 2023-08-07 ENCOUNTER — Other Ambulatory Visit: Payer: Self-pay

## 2023-08-07 ENCOUNTER — Ambulatory Visit: Payer: PRIVATE HEALTH INSURANCE | Admitting: Internal Medicine

## 2023-08-07 ENCOUNTER — Encounter: Payer: Self-pay | Admitting: Internal Medicine

## 2023-08-07 VITALS — BP 96/68 | HR 72 | Ht 64.0 in | Wt 169.0 lb

## 2023-08-07 DIAGNOSIS — Z789 Other specified health status: Secondary | ICD-10-CM

## 2023-08-07 DIAGNOSIS — R42 Dizziness and giddiness: Secondary | ICD-10-CM

## 2023-08-07 NOTE — Progress Notes (Signed)
CARDIOLOGY CONSULT       Dear Dolphus Jenny, NP,    I had the pleasure of seeing your patient, Caroline Blanchard, in cardiology consultation on 08/07/2023 for evaluation of dizziness /lightheadedness.    HPI:  Caroline Blanchard is a 32 y.o. female who has history or COVID in August.      Worse in morning, feels "almost hungover."  Feels very anxious and "heart rate spikes." Feels whoozy, worse with ambulation in the morning.    AppleWatch: HR 68-119.  Just with getting out of bed.    Low heart rate variability.     Hasn't been able to exercise since August - so dizzy and not confident. Walking feels unbalanced.  Vertigo sx - wave or sideways sensation.  Happens randomly, with position changes, walking, sitting, driving.  No near syncope or syncope.  No tunnel vision.  Feels loss of balance like going to fall from unsteadiness. 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     International aid/development worker at store [employer]  Requires long period of standing helping customers which make her feel worse.     Psychiatrist NP recently recommended evaluation for POTs.  Started on propranol 10 mg few weeks ago and this helps.    Also sees counselor.     PAST MEDICAL HISTORY:  Past Medical History:   Diagnosis Date    Anemia     Anxiety     Depression     Head injury     Headache     migraines    Panic attack     Sleep difficulties      History reviewed. No pertinent surgical history.    FAMILY HISTORY  Family History   Problem Relation Age of Onset    Anxiety disorder Mother     Depression Mother     Thyroid disease Sister     Depression Sister     Anxiety disorder Sister     Depression Maternal Grandfather     Anxiety disorder Maternal Grandfather     Depression Maternal Grandmother     Anxiety disorder Maternal Grandmother     Diabetes Paternal Grandfather     Bipolar disorder Paternal Grandfather        SOCIAL HISTORY  Social History     Socioeconomic History    Marital status: Single   Tobacco Use    Smoking status:  Never     Passive exposure: Never    Smokeless tobacco: Never   Vaping Use    Vaping status: Never Used   Substance and Sexual Activity    Alcohol use: Not Currently     Comment: LU 6 months ago    Drug use: Never    Sexual activity: Not Currently         Complete 12 point ROS reviewed and negative except as stated above.     ALLERGIES:  Allergies   Allergen Reactions    Seasonal Allergies Rhinitis      congesttion       CURRENT MEDICATIONS:  Current Outpatient Medications   Medication Sig Dispense Refill    prochlorperazine 5 mg tablet Take 1 tablet (5 mg total) by mouth 3 times daily as needed.      lamoTRIgine 100 mg tablet Take 1 tablet (100 mg total) by mouth daily.      GABAPENTIN 300 MG capsule Take 1 capsule (300 mg total) by mouth nightly.      loratadine (CLARITIN) 10 mg tablet Take  1 tablet (10 mg total) by mouth daily.      pseudoephedrine 30 mg tablet Take 1 tablet (30 mg total) by mouth every 4 hours as needed for Congestion.      propranolol 10 mg tablet Take 1 tablet (10 mg total) by mouth daily.      NURTEC 75 MG disintegrating tablet Take by mouth.      magnesium oxide (MAG-OX) 400 (241.3 mg) mg tablet Take 1 tablet (400 mg total) by mouth daily.      Riboflavin (VITAMIN B2 PO) Take by mouth.      GENERIC EXTERNAL MEDICATION Stress calm      GENERIC EXTERNAL MEDICATION Arnica      Ginger, Zingiber officinalis, (GINGER PO) Take by mouth.      rizatriptan (MAXALT-MLT) 5 MG disintegrating tablet Take 1 tablet (5 mg total) by mouth as needed for Migraine. May repeat in 2 hours if needed      calcium carbonate (TUMS EX) 750 MG chewable tablet Place 1 tablet (750 mg total) into mouth, chew and swallow daily.      cyclobenzaprine (FLEXERIL) 5 mg tablet Take 1-2 tablets (5-10 mg total) by mouth 3 times daily as needed. 45 tablet 1    carboxymethylcellulose-glycerin (REFRESH OPTIVE) 0.5-0.9 % SOLN ophthalmic solution Place 2 drops into both eyes daily as needed (dry red eyes).      EPINEPHrine (EPIPEN) 0.3  mg/0.3 mL auto-injector Inject 0.3 mLs (0.3 mg total) into the muscle as needed (difficulty swallowing).      ketoconazole (NIZORAL) 2 % shampoo Apply topically daily.      ALPRAZolam 0.25 mg tablet Take 1 tablet (0.25 mg total) by mouth daily as needed.       No current facility-administered medications for this visit.       VITALS: BP 96/68   Pulse 72   Ht 1.626 m (5\' 4" ) Comment: transcribed  Wt 76.7 kg (169 lb)   SpO2 99%   BMI 29.01 kg/m   Wt Readings from Last 3 Encounters:   08/07/23 76.7 kg (169 lb)   08/03/23 76.7 kg (169 lb)   07/23/23 77.5 kg (170 lb 14.4 oz)       PHYSICAL EXAM:  CONSTITUTIONAL: Pleasant and interactive.  NEURO: Alert and oriented in no acute distress.  HENT: MMM, oropharynx clear, anicteric sclerae.  LYMPH: Supple without lymphadenopathy.  NECK VASCULAR: JVP is approximately 5 cm above the right atrium.  There are no carotid bruits appreciated bilaterally.  CARDIOVASCULAR: A comprehensive cardiovascular exam was performed with details as follows: Regular rhythm, normal S1-S2, no S3 or S4, there are no murmurs or rubs appreciated. PMI is normal in position in character.  No lower extremity edema noted.  PULM:  Breathing comfortably on room air. Lungs are clear to auscultation bilaterally without wheezes, rales, or rhonchi.  GI: Soft, nontender, nondistended.    Lab Results   Component Value Date/Time    CHOL 168 06/21/2022 07:55 AM    HDL 45 06/21/2022 07:55 AM     Lab Results   Component Value Date/Time    NA 137 07/25/2023 08:22 AM    K 4.3 07/25/2023 08:22 AM    AST 17 07/25/2023 08:22 AM    ALT 14 07/25/2023 08:22 AM     Lab Results   Component Value Date    NA 137 07/25/2023    K 4.3 07/25/2023    CL 100 07/25/2023    CO2 26 07/25/2023    UN 8 07/25/2023  CREAT 0.78 07/25/2023    GLU 77 07/25/2023    AST 17 07/25/2023    ALT 14 07/25/2023     Lab Results   Component Value Date    ALK 61 07/25/2023    ALT 14 07/25/2023    AST 17 07/25/2023       ECG done today and personally  reviewed by myself shows normal sinus rhythm.    ---------------------------------------------------------------------------    ASSESSMENT:  1. Dizziness        RECOMMENDATIONS:    32 year old female who has been having married of symptoms as described above.  Past majority of her symptoms correlate with vertigo for which she is seeing ENT and they discussed with her her symptoms are related to vestibular migraines.  She works with a psychiatric NP and a Veterinary surgeon as well.  She is establishing care with neurology and has a brain MRI pending.    Some of her symptoms have improved with the addition of a low-dose propranolol 10 mg in the morning.  We discussed she could increase this to twice daily.    Some of her symptoms correlate with autonomic dysfunction.  We discussed lifestyle modifications including increasing hydration, liberal salt intake, electrolyte drinks, and compression stockings.    I sent her home with a 2-day cardiac monitor to rule out arrhythmia and will schedule her for an echo to evaluate cardiac structure and function.  I will see her at the time of the echo to review the results.    As always, please do not hesitate if questions or issues arise.  I hope this has been helpful to you and I look forward to continuing to work with you in the future.      Sincerely,    Errik Mitchelle Marchelle Gearing, MD

## 2023-08-07 NOTE — Patient Instructions (Signed)
Lifestyle Modifications for Treatment of Autonomic Dysfunction/Postural Orthostatic Tachycardia Syndrome (POTS)    POTS is a condition that causes a number of symptoms, usually with position change or activity, including fast heart rate, dizziness/fainting, and fatigue. While there's no definitive therapy, the mainstay to treatment remains lifestyle modifications.  Symptoms usually improve slowly with consistency to the below strategies.  If symptoms are not well controlled with below strategies, talk with your cardiologist about medications that may help certain POTS symptoms.     ----------  Make transitions slowly. Move slowly from lying to sitting on the edge of your bed. Stay there for several minutes, allowing your body to naturally adjust to the change in position. Once you're standing, pause and wait before walking to allow your blood pressure to adjust again. If you feel lightheaded at any point, wait for a few minutes in that position to see if it resolves. If not, then return to the prior position. Moving slowly is the key.    ----------  Some general guidelines for preventing flare-ups include:   - Avoid prolonged standing. If you must stand for a long time, try flexing and squeezing your feet and leg muscles as you stand.     - Consider compression stockings during the day (not while sleeping).  These will reduce the tendency for blood pool in the legs when standing and limit drops in blood pressure.    - Avoid alcohol and caffeine which can worsen symptoms.   - Maintain a consistent temperature. Extreme temperatures, especially heat, can make symptoms worse. Air conditioning, cooling vests, handheld misters, personal fans and wearing layers in case of temperature fluctuations can all help. When showering, try to use lukewarm water, as either hot or cold can trigger POTS symptoms. Using a shower chair can also be helpful.    ----------  General guidelines for dietary changes include:   - Liberalize salt  intake - Increase sodium in the diet to at least 2-5 grams per day.    - Increase hydration - Goal is to drink at least 2-3 liters of fluid per day. Water is the best choice. Consider adding 1 bottle of electrolyte drink per day.     - Consider eating five smaller meals per day, rather than three larger meals, to avoid low blood pressure during digestion.   - Consider drinking up to 16 oz of water before getting out of bed to promote a rise in blood pressure.   - Eating a diet with high fiber and complex carbohydrates may help reduce blood sugar spikes and lessen POTS symptoms. Overall, keep your nutrition balanced with whole grains, vegetables, fruits, protein.   - Choose beneficial salty snacks such as salty nuts, pretzels, broth, pickles, olives, sardines, anchovies. Don't over-rely on snack chips and crackers for salt.   - Plan grocery store shopping using a list to make sure you pick up healthy food choices and POTS care (hydration and salty supplements). If you have low stamina, have someone help you shop and carry and put away your groceries.    ----------  Exercise:  Exercising can improve stamina and improve symptoms related to POTS.  Studies show that reclined aerobic exercise, such as swimming, rowing and recumbent bicycling, has the best results. Daily walking is a great start. Short distances to start and increasing slowly as tolerated.  Strengthening your core and leg muscles is also helpful.    Practice isometric exercises: These exercises involve contracting your muscles without actually moving your body.  Isometrics squeeze your muscle and push your blood back toward your heart. They're simple to do, and you can do them lying in bed or seated. It's a good idea to do these in bed before getting up to prepare your body for sitting and standing, or when sitting for prolonged periods while at work/school.      Mornings are more of a challenge for individuals with POTS. Give yourself extra time to get  ready.    ----------  POTS can disrupt your sleep. As sleep is very important for overall health, you should prioritize it.    - Make sure the temperature is ideal in your bedroom to help you get proper rest.   - Try to maintain a consistent sleep schedule. Go to bed consistently at a certain time and set a consistent time to wake up.    - Avoid excessive daytime napping.   - Avoid screens (TV, phone, computer, etc) within 1-2 hours before sleep and avoid using in bed as the light can affect sleep quality.      ----------  POTS can have an impact on mental health.  POTS is a chronic health condition which can predispose to mental health concerns.  In addition, counseling may help manage other coexisting mental health conditions that may negatively influence POTS symptoms. Consider counseling, behavioral therapy, and/or attending POTS support groups (either online or in-person).      ----------  During an episode of dizziness/lightheadedness/feeling faint:   - Sit down or lay down with feet propped up.    - Try to drink 500 mL of water (or bottle of electrolyte drink) quickly. This should increase blood pressure within 5 minutes. The effect will peak at about 30 minutes and can last up to 2 hours to assist in improving low blood pressure.   - Use physical counter-pressure techniques if standing: Handgrip, arm-tensing, leg-crossing.  Sitting down, squatting, or raising and resting your legs on a chair. These maneuvers can increase blood pressure and prevent fainting.

## 2023-08-09 ENCOUNTER — Ambulatory Visit: Payer: PRIVATE HEALTH INSURANCE

## 2023-08-09 ENCOUNTER — Other Ambulatory Visit: Payer: Self-pay

## 2023-08-09 DIAGNOSIS — R1311 Dysphagia, oral phase: Secondary | ICD-10-CM

## 2023-08-09 DIAGNOSIS — K219 Gastro-esophageal reflux disease without esophagitis: Secondary | ICD-10-CM

## 2023-08-10 ENCOUNTER — Encounter: Payer: Self-pay | Admitting: Primary Care

## 2023-08-10 DIAGNOSIS — R42 Dizziness and giddiness: Secondary | ICD-10-CM

## 2023-08-13 ENCOUNTER — Other Ambulatory Visit: Payer: Self-pay

## 2023-08-20 ENCOUNTER — Ambulatory Visit: Payer: No Typology Code available for payment source | Attending: Primary Care | Admitting: Primary Care

## 2023-08-20 DIAGNOSIS — Z8669 Personal history of other diseases of the nervous system and sense organs: Secondary | ICD-10-CM

## 2023-08-20 DIAGNOSIS — F411 Generalized anxiety disorder: Secondary | ICD-10-CM

## 2023-08-20 DIAGNOSIS — G90A Postural orthostatic tachycardia syndrome (POTS): Secondary | ICD-10-CM

## 2023-08-20 DIAGNOSIS — K219 Gastro-esophageal reflux disease without esophagitis: Secondary | ICD-10-CM

## 2023-08-20 NOTE — Progress Notes (Signed)
Caroline Blanchard is a 32 y.o. female. (July 19, 1991)      No chief complaint on file.        There were no vitals taken for this visit.    [facility]  Mode of Communication with Patient for This Visit              Consent was obtained from the patient to complete this video visit; including the potential for financial liability.  Subjective     Caroline Blanchard is a 32 y.o. female who presents for No chief complaint on file.  History of Present Illness  The patient presents via virtual visit for evaluation of migraines, anxiety, heartburn, and POTS.    She has been experiencing migraines, with a frequency of 12 episodes this month and 18 episodes in the previous month.  Currently currently she is consulting a headache clinic run by Erby Pian, nurse practitioner.  she has been prescribed Imitrex for her migraines, with a limitation of 9 tablets per month. She recently started the medication and has only taken 1 dose so far. An MRI of her brain was performed, yielding normal results. She is scheduled to see a neurologist in 09/2023. She received a trigger point injection last Thursday, which provided immediate relief for the initial few days. Prior to the injection, she experienced a prolonged headache lasting 48 hours. The relief from the injection has since subsided. Her healthcare provider is considering filing an insurance claim for Botox treatment, which is believed to have a longer-lasting effect than the trigger point injections. She is currently on propranolol 20 mg daily, an increase from her previous dose of 10 mg, as recommended by her cardiologist, ENT specialist, headache specialist, and psychiatrist. This medication has been beneficial in managing her headaches and POTS. She has a sufficient supply of propranolol.    She reports that her chest symptoms are likely due to anxiety. She is currently on Lamictal 100 mg and has added gabapentin at night to aid with sleep and relaxation, which has  been beneficial. She experienced initial anxiety upon returning to work but has since adjusted. She finds reassurance in knowing that she does not have a significant heart or brain condition, which has also alleviated her anxiety. There was some confusion with the initial paperwork for accommodations at her workplace. It was initially stated that she could work 6 hours a day, but she believed it was 8 hours a day with accommodation for an appointment during the day, reducing her work hours to 6 on days with a doctor's appointment. Her employer requires her to work 8 hours a day unless she has a medical appointment. She plans to contact her employer to clarify this before submitting a new form. She is doing a lot better and has a new therapist she will start seeing because her previous therapist is no longer available.    She has been taking Reflux Gourmet, an alginate, daily for her heartburn, typically after meals in the afternoon or evening. She avoids lying down immediately after taking it. The product is described as providing a protective barrier in the stomach, preventing the contents from entering the esophagus. The recommended dosage is 1 teaspoon or more as needed, or as directed by a physician, after meals and before bedtime. Says she did consult the speech therapist which recommended her not to take that many times which she was taking as it was counteracting and giving her more information than helping that is why  it was advised to discontinue any use of Tums.  She says since she has been using this reflux Gourmet her symptoms are under much better control.  She does not have dysphagia or any heartburn lately    She attributes her fatigue and dizziness to POTS. She underwent a Holter monitor test for 2 days, which revealed normal sinus rhythm but signs of tachycardia. An echocardiogram is scheduled for 09/2023 to further evaluate her heart health.  Her cardiologist also recommended her to do some  lifestyle modification including increasing hydration liberal salt intake, electrolyte drinks and compression stockings.        MEDICATIONS  Current: Imitrex, propranolol, Lamictal, gabapentin, Reflux Gourmet.       Objective   There were no vitals taken for this visit.  Physical Exam    She appears much more relaxed than before.  Answering all questions appropriately.  No acute distress noted.  Results  Imaging  MRI of brain was normal.    Testing  Holter monitor showed normal sinus rhythm with signs of tachycardia.          Assessment & Plan  1. Migraine headaches.  She has been experiencing frequent migraines, with 12 headaches this month and 18 last month. She is currently taking propranolol 10 mg twice per day , which was increased from 10 mg daily. She has also been prescribed Imitrex and has taken 1 tablet this month. She received a trigger point injection 4 days ago, which provided relief for a few days. She is considering Botox injections for longer-lasting relief and is awaiting insurance approval.    2. Anxiety.  Her anxiety has improved since returning to work and receiving confirmation that she does not have major heart or brain issues. She is currently on Lamictal 100 mg daily and has added gabapentin 300 mg at night to help with sleep and relaxation. She reports that this combination is helping her manage her anxiety better.    3.  GERD   She has been taking Reflux Gourmet, an alginate gel, 1 teaspoon after meals to manage her heartburn. It was discussed that this should be taken half an hour before meals to provide a protective barrier in the stomach and esophagus. However, she was advised to follow the current instructions of taking it after meals as per the product's guidelines.    4. Postural Orthostatic Tachycardia Syndrome (POTS).  She reports that her fatigue and dizziness are side effects of POTS. She is scheduled for an echocardiogram in February to further evaluate her condition. A recent  Holter monitor showed normal sinus rhythm with signs of tachycardia.    Follow-up  The patient will follow up in a couple of months.    PROCEDURE  The patient received a trigger point injection last Thursday, which provided immediate relief for the initial few days.                 Author: Frances Maywood, NP  Note signed: 08/20/2023              Allergies   Allergen Reactions    Seasonal Allergies Rhinitis      congesttion         Current Outpatient Medications   Medication Sig Dispense Refill    prochlorperazine 5 mg tablet Take 1 tablet (5 mg total) by mouth 3 times daily as needed.      lamoTRIgine 100 mg tablet Take 1 tablet (100 mg total) by mouth daily.  GABAPENTIN 300 MG capsule Take 1 capsule (300 mg total) by mouth nightly.      loratadine (CLARITIN) 10 mg tablet Take 1 tablet (10 mg total) by mouth daily.      pseudoephedrine 30 mg tablet Take 1 tablet (30 mg total) by mouth every 4 hours as needed for Congestion.      propranolol 10 mg tablet Take 2 tablets (20 mg total) by mouth daily.      rizatriptan (MAXALT-MLT) 5 MG disintegrating tablet Take 1 tablet (5 mg total) by mouth as needed for Migraine. May repeat in 2 hours if needed      NURTEC 75 MG disintegrating tablet Take by mouth.      magnesium oxide (MAG-OX) 400 (241.3 mg) mg tablet Take 1 tablet (400 mg total) by mouth daily.      Riboflavin (VITAMIN B2 PO) Take by mouth.      GENERIC EXTERNAL MEDICATION Stress calm      GENERIC EXTERNAL MEDICATION Arnica      Ginger, Zingiber officinalis, (GINGER PO) Take by mouth.      cyclobenzaprine (FLEXERIL) 5 mg tablet Take 1-2 tablets (5-10 mg total) by mouth 3 times daily as needed. 45 tablet 1    carboxymethylcellulose-glycerin (REFRESH OPTIVE) 0.5-0.9 % SOLN ophthalmic solution Place 2 drops into both eyes daily as needed (dry red eyes).      EPINEPHrine (EPIPEN) 0.3 mg/0.3 mL auto-injector Inject 0.3 mLs (0.3 mg total) into the muscle as needed (difficulty swallowing).      ketoconazole (NIZORAL) 2  % shampoo Apply topically daily.      ALPRAZolam 0.25 mg tablet Take 1 tablet (0.25 mg total) by mouth daily as needed.       No current facility-administered medications for this visit.         Past Medical History:   Diagnosis Date    Anemia     Anxiety     Depression     Head injury     Headache     migraines    Panic attack     Sleep difficulties          No past surgical history on file.      Social History     Tobacco Use    Smoking status: Never     Passive exposure: Never    Smokeless tobacco: Never   Vaping Use    Vaping status: Never Used   Substance Use Topics    Alcohol use: Not Currently     Comment: LU 6 months ago    Drug use: Never         No follow-ups on file.

## 2023-08-27 ENCOUNTER — Encounter: Payer: Self-pay | Admitting: Primary Care

## 2023-08-27 LAB — EKG 12-LEAD
P: 34 deg
PR: 156 ms
QRS: 64 deg
QRSD: 79 ms
QT: 378 ms
QTc: 414 ms
Rate: 72 {beats}/min
T: 93 deg

## 2023-08-30 ENCOUNTER — Encounter: Payer: Self-pay | Admitting: Gastroenterology

## 2023-09-03 ENCOUNTER — Ambulatory Visit: Payer: Self-pay

## 2023-09-03 NOTE — Progress Notes (Signed)
SWALLOWING THERAPY PROGRESS NOTE      Patient Name: Caroline Blanchard  Date of Exam: 09/03/2023  Date of Birth: 1991/04/23  Patient MRN: [id number]  Referring Physician: Frances Maywood, NP  Diagnosis:  Oral dysphagia    Presenting Complaint:    History of Complaint:    Patient seen today for swallow evaluation due to difficulty swallowing and fear of swallowing.  She reports having an episode back in May where she felt like her tongue swelled and she had trouble swallowing.  She then had a second episode where she choked but she was able to get the food out herself.  She underwent allergy testing and was found to not have any allergies.  She reports that over the past month she has felt soreness in her throat and cannot swallow anything unless it is soft and easy to chew, small bites.  It takes her a long time to eat now.  She endorses being anxious when eating and afraid because she is home alone if she chokes again.  She reports a lifelong difficulty of swallowing pills.  She also endorses having throat and chest pains.  She also had COVID in August, and reports that she has not felt normal since then.  Patient states her behavioral health provider has concerns that she may have POTS.  She reports having frequent heartburn, even when eating more bland food.  She is managing it primarily with Tums and ginger.  She reports having lost 6 pounds in the past week.  She also reports significant TMJ symptoms, including cracking teeth.  She describes her throat is feeling small, and when she swallows she feels things get stuck and gags.  She tries to use water to help her food get down.  She is primarily been eating smooth protein bars, yogurt, soft cheese, soft bread, soft chicken nuggets, soups, and hot tea.  She reports feeling frequent throat clearing and postnasal drip.    Past Medical History:   Diagnosis Date    Anemia     Anxiety     Depression     Head injury     Headache     migraines    Panic attack     Sleep  difficulties        Current Outpatient Medications   Medication Sig    SUMAtriptan (IMITREX) 50 mg tablet Take 1 tablet (50 mg total) by mouth as needed for Migraine. Take at onset of headache. May repeat once in 2 hours.    prochlorperazine 5 mg tablet Take 1 tablet (5 mg total) by mouth 3 times daily as needed.    lamoTRIgine 100 mg tablet Take 1 tablet (100 mg total) by mouth daily.    GABAPENTIN 300 MG capsule Take 1 capsule (300 mg total) by mouth nightly.    loratadine (CLARITIN) 10 mg tablet Take 1 tablet (10 mg total) by mouth daily.    pseudoephedrine 30 mg tablet Take 1 tablet (30 mg total) by mouth every 4 hours as needed for Congestion.    propranolol 10 mg tablet Take 1 tablet (10 mg total) by mouth 2 times daily.    magnesium oxide (MAG-OX) 400 (241.3 mg) mg tablet Take 1 tablet (400 mg total) by mouth daily.    Riboflavin (VITAMIN B2 PO) Take by mouth.    GENERIC EXTERNAL MEDICATION Stress calm    GENERIC EXTERNAL MEDICATION Arnica    Ginger, Zingiber officinalis, (GINGER PO) Take by mouth.    cyclobenzaprine (FLEXERIL) 5 mg  tablet Take 1-2 tablets (5-10 mg total) by mouth 3 times daily as needed.    carboxymethylcellulose-glycerin (REFRESH OPTIVE) 0.5-0.9 % SOLN ophthalmic solution Place 2 drops into both eyes daily as needed (dry red eyes).    EPINEPHrine (EPIPEN) 0.3 mg/0.3 mL auto-injector Inject 0.3 mLs (0.3 mg total) into the muscle as needed (difficulty swallowing).    ketoconazole (NIZORAL) 2 % shampoo Apply topically daily.    ALPRAZolam 0.25 mg tablet Take 1 tablet (0.25 mg total) by mouth daily as needed.       S: followup. Pt reports no change in symptoms since evaluation.  O: Education provided for:  -respiratory coordination and relaxation of neck and torso  SOVT for pharygeal muscle relaxation  A: Pt reports continuing symptoms.  She was able to demonstrated proper form for exercises today.  She is encouraged to use respiratory strategies during meals to monitor for reactive muscles  that may tighten and contributed to swallowing difficulties.  P: Continue with POC.  Consider trials next session.     Bethann Punches, M.M., M.S., CCC-SLP  Speech-Language Pathologist

## 2023-09-05 ENCOUNTER — Encounter: Payer: Self-pay | Admitting: Primary Care

## 2023-09-05 ENCOUNTER — Encounter: Payer: Self-pay | Admitting: Neurology

## 2023-09-06 NOTE — Telephone Encounter (Signed)
Please cancel Caroline Blanchard's NPV with me on 10/16/2023 per her My Chart message response.  She has established care with another clinician.  Thank you.

## 2023-09-07 ENCOUNTER — Encounter: Payer: Self-pay | Admitting: Gastroenterology

## 2023-09-21 ENCOUNTER — Encounter: Payer: Self-pay | Admitting: Primary Care

## 2023-09-28 ENCOUNTER — Ambulatory Visit
Payer: PRIVATE HEALTH INSURANCE | Attending: Student in an Organized Health Care Education/Training Program | Admitting: Primary Care

## 2023-09-28 ENCOUNTER — Encounter: Payer: Self-pay | Admitting: Primary Care

## 2023-09-28 ENCOUNTER — Other Ambulatory Visit: Payer: Self-pay

## 2023-09-28 VITALS — BP 104/60 | HR 95 | Temp 97.5°F | Resp 16 | Ht 64.0 in | Wt 164.0 lb

## 2023-09-28 DIAGNOSIS — K219 Gastro-esophageal reflux disease without esophagitis: Secondary | ICD-10-CM

## 2023-09-28 NOTE — Progress Notes (Unsigned)
[facility] - Caroline Blanchard is a 33 y.o. female with past medical history of anxiety, anemia, and depression.   [place] out of [place] and [place] to back to the [place] area to live with her parents in September 2023.   She completed a partial in patient.     Follows with Psychiatry:     08/03/2023 Audiologic evaluation secondary to vertigo: results indicate normal ear canal volume, compliance, and pressure, consistent with normal Eustachian Tube function, bilaterally.     08/03/2023 ENT: Migraine medications, draminine no     08/07/2023 Cardiology: for dizziness, 2-day cardiac monitor   Underlying rhythm was sinus with an average heart rate of 78 bpm and a range 55-158 bpm.  There were rare PACs (16) and no SVT.    There were rare PVCs (3) and no VT.   There were no significant pauses, evidence of high degree AV block, or evidence of paroxysmal atrial fibrillation.    Patient reported symptoms of lightheadedness, shortness of breath, chest pain/pressure which correlated with sinus rhythm and sinus tachycardia without arrhythmia and a heart rate 66-128 bpm.       08/20/2023 Telemedicine: Continues to have headaches follows with Erby Pian NP, MRI completed normal results. Heartburn takes reflux gourmet and speech said not to take daily.     Intermitting leave of absence from work??    Subjective     Caroline Blanchard is a 33 y.o. female who presents for No chief complaint on file.  History of Present Illness    Review of Systems   Gastrointestinal:  Positive for constipation, heartburn and nausea.        Every day   No citrus, , chocolate , no spice             Objective   There were no vitals taken for this visit.  Physical Exam      Results            Assessment & Plan        {Time Based Attestation and Data Reviewed (Optional):[id number]}           Author: Dolphus Jenny, NP  Note signed: 09/28/2023

## 2023-10-01 ENCOUNTER — Encounter: Payer: Self-pay | Admitting: Primary Care

## 2023-10-08 ENCOUNTER — Ambulatory Visit
Admission: RE | Admit: 2023-10-08 | Discharge: 2023-10-08 | Disposition: A | Discharge status: Home / Self Care | Payer: PRIVATE HEALTH INSURANCE | Source: Ambulatory Visit | Attending: Cardiology | Admitting: Cardiology

## 2023-10-08 ENCOUNTER — Ambulatory Visit: Payer: Self-pay | Admitting: Internal Medicine

## 2023-10-08 ENCOUNTER — Other Ambulatory Visit: Payer: Self-pay

## 2023-10-08 ENCOUNTER — Telehealth: Payer: Self-pay

## 2023-10-08 DIAGNOSIS — R42 Dizziness and giddiness: Secondary | ICD-10-CM | POA: Insufficient documentation

## 2023-10-08 LAB — ECHO COMPLETE
Aortic Arch Diameter: 1.6 cm
Aortic Diameter (mid tubular): 2.4 cm
Aortic Diameter (sinus of Valsalva): 2.7 cm
BMI: 27.9 kg/m2
BP Diastolic: 86 mm[Hg]
BP Systolic: 122 mm[Hg]
BSA: 1.82 m2
Deceleration Time - MV: 167 ms
E/A ratio: 1.51
Heart Rate: 81 {beats}/min
Height: 64 in
IVC Diameter: 1.7 cm
LA Diameter BSA Index: 1.6 cm/m2
LA Diameter Height Index: 1.8 cm/m
LA Diameter: 3 cm
LA Systolic Vol BSA Index: 26.8 mL/m2
LA Systolic Vol Height Index: 30 mL/m
LA Systolic Volume: 48.8 mL
LV ASE Mass BSA Index: 40.5 g/m2
LV ASE Mass Height 2.7 Index: 19.8 g/m2
LV ASE Mass Height Index: 45.3 gm/m
LV ASE Mass: 73.6 g
LV CO BSA Index: 2.65 L/min/m2
LV Cardiac Output: 4.82 L/min
LV Diastolic Volume Index: 50.4 mL/m2
LV Posterior Wall Thickness: 0.49 cm
LV SV - LVOT SV Diff: -11.51 %
LV SV - LVOT SV Diff: -6.85 mL
LV SV BSA Index: 32.7 mL/m2
LV SV Height Index: 36.6 mL/m
LV Septal Thickness: 0.72 cm
LV Stroke Volume: 59.5 mL
LV Systolic L Strain: -19.1 %
LV Systolic Volume Index: 17.7 mL/m2
LV wall/cavity ratio: 0.28
LVED Diameter BSA Index: 2.36 cm/m2
LVED Diameter Height Index: 2.65 cm/m
LVED Diameter: 4.3 cm
LVED Volume BSA Index: 50 mL/m2
LVED Volume BSA Index: 50.4 mL/m2
LVED Volume Height Index: 56.4 mL/m
LVED Volume: 91.7 mL
LVEF (Volume): 65 %
LVES Diameter BSA Index: 1.37 cm/m2
LVES Diameter Height Index: 1.54 cm/m
LVES Diameter: 2.5 cm
LVES Volume BSA Index: 17.7 mL/m2
LVES Volume BSA Index: 18 mL/m2
LVES Volume Height Index: 19.8 mL/m
LVES Volume: 32.2 mL
LVOT Area (calculated): 3.43 cm2
LVOT Cardiac Index: 2.95 L/min/m2
LVOT Cardiac Output: 5.37 L/min
LVOT Diameter: 2.09 cm
LVOT PWD VTI: 19.35 cm
LVOT PWD Velocity (mean): 68.5 cm/s
LVOT PWD Velocity (peak): 96.7 cm/s
LVOT SV BSA Index: 36.46 mL/m2
LVOT SV Height Index: 40.8 mL/m
LVOT Stroke Rate (mean): 234.9 mL/s
LVOT Stroke Rate (peak): 331.6 mL/s
LVOT Stroke Volume: 66.35 mL
MR Regurgitant Fraction (LV SV Mtd): -0.12
MR Regurgitant Volume (LV SV Mtd): -6.9 mL
MV Peak A Velocity: 55.9 cm/s
MV Peak E Velocity: 84.5 cm/s
Mitral Annular E/Ea Vel Ratio: 8.15
Mitral Annular Ea Velocity: 10.37 cm/s
RA Pressure Estimate: 5 mm[Hg]
RA Volume BSA Index: 14.8 mL/m2
RA Volume Height Index: 16.6 mL/m
RA Volume: 27 mL
RR Interval: 740.74 ms
RVED Diameter BSA Index: 1.8 cm/m2
RVED Diameter Height Index: 2 cm/m
RVED Diameter: 3.33 cm
SEM (LVOT Mean) mN-s: 47.75 mN-s
SEM (LVOT peak) mN-s: 67.41 mN-s
Tricuspid Annular S velocity: 13 cm/s
Tricuspid Annular Systolic Plane Excursion (TAPSE): 2.1 cm
Weight (lbs): 162 [lb_av]
Weight: 2592 [oz_av]

## 2023-10-08 NOTE — Telephone Encounter (Signed)
Talked to pt she will CB to schedule  2/10 BUMP- [place]- Follow-up disposition: Follow up for 1-2 mo f/up with an echo.     Keeping echo on 2/10

## 2023-10-16 ENCOUNTER — Ambulatory Visit: Payer: Self-pay | Admitting: Neurology

## 2023-10-22 ENCOUNTER — Encounter: Payer: Self-pay | Admitting: Internal Medicine

## 2023-10-22 ENCOUNTER — Telehealth: Payer: Self-pay

## 2023-10-22 NOTE — Telephone Encounter (Signed)
 Copied from CRM #[id number]. Topic: Appointments - Schedule Appointment  >> Oct 22, 2023 10:26 AM Alessandra Bevels wrote:  Patient called to check on status of referral. Reason for referral Gastroesophageal reflux disease, unspecified whether esophagitis present, Interventional Endoscopy Other: patient is having increase GI symptoms. Patient does chew her all her pills. Writer advised referral has been received.   Spoke with patient, regarding referral received. Patient was asked:  Have you seen a GI provider before or been seen by any provider for a GI concern (If yes, who and where are they located)? No   Is this a transfer of care? No   Is this a second opinion? No   Is referral marked Urgent from the referring? No     Scheduled appt and assigned referral for Kathyrn Drown, NP    Patient has been scheduled.    Date: 12/04/23 at [facility] (also added to wait list)  Time: 1:45pm arrival  Provider: Kathyrn Drown, NP  Reason for appointment: NPV  Referral on file? Yes    Patient can be reached if necessary at [phone] .

## 2023-10-29 ENCOUNTER — Ambulatory Visit: Payer: PRIVATE HEALTH INSURANCE | Admitting: Internal Medicine

## 2023-10-29 ENCOUNTER — Ambulatory Visit: Payer: PRIVATE HEALTH INSURANCE | Admitting: Primary Care

## 2023-11-06 NOTE — Telephone Encounter (Unsigned)
 Copied from CRM #[id number]. Topic: Appointments - Appointment Information  >> Nov 06, 2023 11:38 AM Lelan Pons wrote:  Caroline Blanchard, Patient, is calling.  Patient has a scheduled appointment on 12/04/23 with Huel Cote. Patient requested to be added to cancellation list for sooner appointment. . Patient can be reached at [phone]

## 2023-11-08 ENCOUNTER — Encounter: Payer: Self-pay | Admitting: Primary Care

## 2023-12-04 ENCOUNTER — Other Ambulatory Visit: Payer: Self-pay

## 2023-12-04 ENCOUNTER — Ambulatory Visit: Payer: PRIVATE HEALTH INSURANCE | Attending: Internal Medicine | Admitting: Gastroenterology

## 2023-12-04 ENCOUNTER — Telehealth: Payer: Self-pay

## 2023-12-04 ENCOUNTER — Encounter: Payer: Self-pay | Admitting: Gastroenterology

## 2023-12-04 VITALS — BP 117/83 | HR 85 | Temp 97.9°F | Ht 64.0 in | Wt 154.0 lb

## 2023-12-04 DIAGNOSIS — R1310 Dysphagia, unspecified: Secondary | ICD-10-CM

## 2023-12-04 DIAGNOSIS — K219 Gastro-esophageal reflux disease without esophagitis: Secondary | ICD-10-CM

## 2023-12-04 DIAGNOSIS — R142 Eructation: Secondary | ICD-10-CM

## 2023-12-04 DIAGNOSIS — R634 Abnormal weight loss: Secondary | ICD-10-CM

## 2023-12-04 DIAGNOSIS — R110 Nausea: Secondary | ICD-10-CM

## 2023-12-04 DIAGNOSIS — R1013 Epigastric pain: Secondary | ICD-10-CM

## 2023-12-04 DIAGNOSIS — R1011 Right upper quadrant pain: Secondary | ICD-10-CM

## 2023-12-04 MED ORDER — PANTOPRAZOLE SODIUM 20 MG PO TBEC *I*
20.0000 mg | DELAYED_RELEASE_TABLET | Freq: Every day | ORAL | 0 refills | Status: DC
Start: 2023-12-04 — End: 2024-02-09

## 2023-12-04 NOTE — Patient Instructions (Addendum)
-   Schedule upper endoscopy today  - start pantoprazole 20 mg daily, open capsule and sprinkle on apple sauce, eat right away, Take 30 minutes before eating  - Schedule glucose hydrogen breath test   - Complete blood work with any [facility] lab  - Schedule abdominal ultrasound. Please call radiology at [phone] to schedule your imaging.   - Follow 4 in months

## 2023-12-04 NOTE — Progress Notes (Signed)
 Caroline Blanchard  [id number]     12/05/2023  Dolphus Jenny, NP  [address]  [place],  [place] [address]        Chief Complaint/Reason for Office Visit: GERD     We had the pleasure of seeing your patient, Caroline Blanchard, in the outpatient gastroenterology/hepatology clinic. As you know, she is a 33 y.o. female with a past medical history significant for anxiety, anemia, depression, POTS, dysphagia  who is referred to our office for GERD, nausea, weight loss, excess belching.    Per referral documentation, "She reports persistent heartburn and acid reflux, which has been exacerbated over the past 2 weeks. She has been taking Tums, which provides temporary relief but causes severe constipation. She is advised to avoid supplements and maintain a balanced diet. She is encouraged to continue her current diet and avoid eating 3 to 4 hours before bedtime. She is advised to avoid foods that trigger her symptoms, such as coffee, chocolate, spicy foods, tomato sauce, and citrus fruits. She is also advised to avoid carbonated beverages and dairy products, which worsen her symptoms. Recommendation is to stop chewing pills which could leading to dump syndrome, which can leading to the body absorbing the medications too quickly and or cause irrtation in the mouth and throat. A referral to a gastroenterologist will be made to rule out esophagitis or other potential issues. She is advised to take Mylanta as needed for heartburn relief and to ensure adequate hydration after taking her medications."     She has followed with speech therapy, most recently in 07/2023 per chart review. Per speech, "Her symptoms are most consistent with possible pharyngeal and esophageal irritation from uncontrolled reflux, which may have led to initial episodes and have contributed to her oral phase issues. Informal oral/pharyngeal and communication assessments are all within normal limits. She may benefit from additional reflux treatment and will refer back  to her PCP for guidance. If necessary, she may also benefit in the future from treatment of TMJ symptoms, which may be contributing to muscle tension and difficulties of swallow function."     Reports that over a year ago she started with difficulty swallowing that felt somewhat like the muscles were stuck/frozen in her throat. Started to see Speech since then. Shares that she also noted some increased acid reflux symptoms and nausea. Reports that she was started on reflux gourmet and found it somewhat helpful. Reports that her swallowing had improved a bit after following with Speech. She does report that she feels much of the swallowing difficulty was related to anxiety and stress which is improving for her. Shares that she was also burping acid prior to starting the reflux gourmet and this has improved, however she does continue with frequent/bothersome belching without the acid.     Reports that in 08/2023 her swallowing improved and was able to eat foods other than soft foods.     She has cut out caffeine, alcohol, citrus/citric acid, eats very bland diet. Reports that she stopped most gluten and dairy. She reports that she limited her diet with the goal of improving her nausea which is one of her most bothersome symptoms. Reports that she does continue with nausea with limiting diet but improved. Reports that she feels zofran made her constipated. She added a pre/pro-biotic and is having a BM daily. Reports that Pepcid and Mylanta caused epigastric pain and pressure when she tried. Reports that she has tried ginger and it is somewhat helpful, notices that it can  cause burning in her chest. She wears OTC wrist bands and Emetrol for nausea which she finds most helpful. Reports that she has lost 30 lbs since the Fall that she relates to nausea and restricting her diet. Per chart review she has lost about 25 lbs since 04/2023.     Reports that her nausea and burping are most bothersome symptoms. She also shares some  intermittent RUQ pain bothersome after eating or when she is nauseous.     Reports that she had an EGD about 10 years ago. Reports that it was for heartburn and burping, does not recall any concerning results and notes she was experiencing increased stress that that time as well.     Reports that she followed with cardiology for right sided chest pain and was cleared of cardiac concerns.      Reports that she is seeing a HA specialist and is planning to get botox injections for HAs.     Patient has a bowel movement daily. Denies hematochezia, melena. Denies jaundice, icterus. Denies NSAID use.     She denies alcohol use. She does not smoke.     Allergies/Sensitivities:Allergies[1]    Medications:   Current Medications[2]      Past Medical Hx:   Past Medical History:   Diagnosis Date    Anemia     Anxiety     Depression     Head injury     Headache     migraines    Panic attack     Sleep difficulties        Past Surgical Hx: History reviewed. No pertinent surgical history.    Social Hx:   Social History     Tobacco Use    Smoking status: Never     Passive exposure: Never    Smokeless tobacco: Never   Substance Use Topics    Alcohol use: Not Currently     Comment: LU 6 months ago       Family Hx: History reviewed.   Family History   Problem Relation Age of Onset    Anxiety disorder Mother     Depression Mother     Thyroid disease Sister     Depression Sister     Anxiety disorder Sister     Depression Maternal Grandfather     Anxiety disorder Maternal Grandfather     Depression Maternal Grandmother     Anxiety disorder Maternal Grandmother     Diabetes Paternal Grandfather     Bipolar disorder Paternal Grandfather          ROS:   Per HPI     Vitals:   Vitals:    12/04/23 1358   BP: 117/83   Pulse: 85   Temp: 36.6 C (97.9 F)   Weight: 69.9 kg (154 lb)   Height: 162.6 cm (5\' 4" )      Body mass index is 26.43 kg/m.    Physical Exam:   General: well developed well nourished, NAD  Skin:  No rashes, jaundice.  Warm and  dry.   Lymphatics:  No peripheral adenopathy palpated in SCF or cervical chains.    HEENT:  No icterus.      Neck:  No masses or tracheal deviation.  Supple.  Lungs:  Clear bilaterally to auscultation. Normal respiratory effort.    Cor:  RRR without murmur.    Abdomen:  Normal bowel sounds, no bruits.  Non-tender, non-distended. No hepatosplenomegaly, masses, hernias, or fluid wave/shifting dullness.  Extremities:  Warm, no edema.  Neuro:  Alert and oriented x 3 with appropriate affect.  Ambulatory.  No gross motor deficits noted.      GI Procedures:  Reports EGD over 10 years ago, not available to review on chart review today     Labs/Imaging:        Lab results: 07/25/23  0822   WBC 7.9   Hemoglobin 14.9   Hematocrit 45   RBC 5.0   Platelets 249           Lab results: 07/25/23  1610   Sodium 137   Potassium 4.3   Chloride 100   CO2 26   UN 8   Creatinine 0.78   Glucose 77   Calcium 9.7   Total Protein 6.9   Albumin 4.7   ALT 14   AST 17   Alk Phos 61   Bilirubin,Total 0.7         Impression(s)/Recommendation(s):   Caroline Blanchard is a 33 y.o. female with a past medical history significant for anxiety, anemia, depression, POTS, dysphagia  who is referred to our office for GERD, dysphagia, nausea, weight loss, excess belching. To review, she reports about 1 year history of increased GERD symptoms, intermittent dysphagia with feeling of tightness in throat, nausea, weight loss, and excess belching. Having intermittent RUQ pain related to  eating/when nauseous.. She also reports some constipation that improved with pre/probiotics. Most bothersome symptoms at this time are nausea and belching. She followed with Speech therapy, reviewed above, and she feels dysphagia improved after these visits. Reports that she feels the increase in symptoms may be a least partially related to increased stress as they have improved some with stress reduction. Finds OTC wrist bands and Emetrol helpful for nausea. She has greatly  restricted her diet to try to improve her symptoms, reviewed above. Diet restrictions are mildly improving symptoms and she has started to slightly liberate her diet. I do suspect that ARFED may be playing a role in her food restrictions and weight loss. She is concerned for nutrient deficiencies given weight loss and restrictions, she requested labs to evaluate. Discussed that pending findings of labs we may recommend that she follow with PCP on results.     Discussed in detail starting a PPI for acid suppresion to see if symptoms improve. She cannot swallow pills, she is recommended to start pantoprazole capsule, open capsule and sprinkle on applesauce/take right away. Also recommended glucose HBT to rule out SIBO given nause anad excess beltching. Recommend abdominal US for RUQ pain postprandially. Also dicussed EGD for further investigation with anesthesia given her anxiety. Discussed that her symptoms may be related to IBS, however we need to exclude other GI related causes prior to diagnosis. Differential diagnosis is broad given multiple GI complaints/concerns including IBS, GERD, gastritis, upper GI ulcer, esophageal stricture, esophageal spasm, gall stones, cholecystitis, ARFED, SIBO, fructose.fructan intolerance, lactose intolerance, celiac disease. Recommendations as follows:    Discussed risks of EGD including infection, bleeding and perforation. Patient will need to be driven to/from procedures due to sedation. Patient understands and agrees with plan.    Nausea  Excess belching  Weight loss  RUQ pain  Dysphagia   GERD, dyspepsia   - Schedule EGD with anesthesia, no anti-coags  - Start pantoprazole, 20 mg daily, open capsule and sprinkle on apple sauce, eat right away, Take 30 minutes before eating breakfast  - Schedule glucose HBT  - Complete requested blood work as below   -  Schedule ABD [place]  - Consider lactose HBT and fructose/fructan HBT      Follow up in 4 months     We thank you for allowing [place] to  participate in this patient's care.  Please do not hesitate to contact us with any questions or concerns at [phone].    Please note all efforts are made to assure accuracy of this note. Mistakes can be made as we do not have scribes and real time note taking. Information detailed in the note was taken from direct patient interviews as well as chart review. If you find something important you wish corrected in the record please let us know.       Kathyrn Drown, NP     Orders Placed This Encounter   Procedures    EGD w/ Anesthesia    US abdomen limited single quad or f/u specify    Zinc    Folate    Magnesium    Iron    TIBC    Transferrin    Ferritin    Vitamin B12    25-OH VITAMIN D2 AND D3 BY LCMS    CBC    C reactive protein    Hydrogen Breath Test - Glucose 75 grams p.o. x1             [1]   Allergies  Allergen Reactions    Seasonal Allergies Rhinitis      congesttion   [2]   Current Outpatient Medications:     PREBIOTIC PRODUCT PO, Take 1 capsule by mouth daily., Disp: , Rfl:     hydrOXYzine HCl 10 mg tablet, Take 1 tablet (10 mg total) by mouth 3 times daily., Disp: , Rfl:     lamoTRIgine 100 mg tablet, Take 1 tablet (100 mg total) by mouth daily., Disp: , Rfl:     loratadine (CLARITIN) 10 mg tablet, Take 1 tablet (10 mg total) by mouth daily., Disp: , Rfl:     magnesium oxide (MAG-OX) 400 (241.3 mg) mg tablet, Take 1 tablet (400 mg total) by mouth daily., Disp: , Rfl:     Ginger, Zingiber officinalis, (GINGER PO), Take by mouth., Disp: , Rfl:     carboxymethylcellulose-glycerin (REFRESH OPTIVE) 0.5-0.9 % SOLN ophthalmic solution, Place 2 drops into both eyes daily as needed (dry red eyes)., Disp: , Rfl:     EPINEPHrine (EPIPEN) 0.3 mg/0.3 mL auto-injector, Inject 0.3 mLs (0.3 mg total) into the muscle as needed (difficulty swallowing)., Disp: , Rfl:     pantoprazole (PROTONIX) 20 mg EC tablet, Take 1 tablet (20 mg total) by mouth daily. Swallow whole. Do not crush, break, or chew., Disp: 30 tablet, Rfl:  0    pseudoephedrine 30 mg tablet, Take 1 tablet (30 mg total) by mouth every 4 hours as needed for Congestion., Disp: , Rfl:     cyclobenzaprine (FLEXERIL) 5 mg tablet, Take 1-2 tablets (5-10 mg total) by mouth 3 times daily as needed., Disp: 45 tablet, Rfl: 1    ketoconazole (NIZORAL) 2 % shampoo, Apply topically daily., Disp: , Rfl:

## 2023-12-04 NOTE — Telephone Encounter (Signed)
 Spoke with Caroline Blanchard , patient, at check out:     Patient was seen by Huel Cote today and the Provider's recommendations were:   Follow up in about 4 months (around 04/04/2024).    Check out comments: Schedule EGD  Schedule glucose HBT   .       Scheduled HBT but was unable to schedule EGD and fuv due to Not open slot for procedure and the patient want to be schedule after procedure for her fuv    AVS provided to patient via mychart

## 2023-12-05 ENCOUNTER — Telehealth: Payer: Self-pay

## 2023-12-05 NOTE — Telephone Encounter (Signed)
 Spoke with to pt to schedule EGD-[facility] and offered 4.17 ... Pt declined       Pt is requesting a Monday

## 2023-12-12 ENCOUNTER — Telehealth: Payer: Self-pay

## 2023-12-12 NOTE — Telephone Encounter (Signed)
 Called pt and left message [id number] EGD-[facility]

## 2023-12-13 ENCOUNTER — Telehealth: Payer: Self-pay

## 2023-12-13 NOTE — Telephone Encounter (Signed)
Called pt and left message .... Mailed letter re:schedule EGD-[facility]

## 2023-12-13 NOTE — Telephone Encounter (Signed)
EXIT

## 2023-12-17 ENCOUNTER — Other Ambulatory Visit: Payer: Self-pay

## 2023-12-17 NOTE — Telephone Encounter (Signed)
 Copied from CRM #[id number]. Topic: Appointments - Reschedule Appointment  >> Dec 17, 2023  4:07 PM [name] wrote:  Caroline Blanchard, Patient, is returning a call from [place] and states this is regarding EGD with [facility].  Was the appropriate expectation set with the patient for a time frame to receive a return call from the office? N/A  Patient can be reached at:[phone]  Writer attempted to transfer to IBD.  Transfer completed? No, phone not ringing going straight to dial tone, or ringing then dial tone. (Reason)  Caroline Blanchard, Patient, is calling.  Patient has a scheduled appointment on HBT glucose on 6/9.   Writer attempted to schedule nothing sooner was available at the time.

## 2023-12-18 ENCOUNTER — Ambulatory Visit
Admission: RE | Admit: 2023-12-18 | Discharge: 2023-12-18 | Disposition: A | Discharge status: Home / Self Care | Payer: PRIVATE HEALTH INSURANCE | Source: Ambulatory Visit | Attending: Gastroenterology | Admitting: Gastroenterology

## 2023-12-18 ENCOUNTER — Ambulatory Visit: Payer: Self-pay | Admitting: Gastroenterology

## 2023-12-18 ENCOUNTER — Other Ambulatory Visit
Admission: RE | Admit: 2023-12-18 | Discharge: 2023-12-18 | Disposition: A | Discharge status: Home / Self Care | Payer: PRIVATE HEALTH INSURANCE | Source: Ambulatory Visit | Attending: Gastroenterology | Admitting: Gastroenterology

## 2023-12-18 DIAGNOSIS — R1011 Right upper quadrant pain: Secondary | ICD-10-CM | POA: Insufficient documentation

## 2023-12-18 DIAGNOSIS — R634 Abnormal weight loss: Secondary | ICD-10-CM | POA: Insufficient documentation

## 2023-12-18 DIAGNOSIS — R110 Nausea: Secondary | ICD-10-CM | POA: Insufficient documentation

## 2023-12-18 DIAGNOSIS — R142 Eructation: Secondary | ICD-10-CM | POA: Insufficient documentation

## 2023-12-18 LAB — TIBC
Iron: 110 ug/dL (ref 34–165)
TIBC: 365 ug/dL (ref 250–450)
Transferrin Saturation: 30 % (ref 15–50)

## 2023-12-18 LAB — TRANSFERRIN: Transferrin: 270 mg/dL (ref 200–360)

## 2023-12-18 LAB — FERRITIN: Ferritin: 49 ng/mL (ref 10–120)

## 2023-12-18 LAB — CBC
Hematocrit: 43 % (ref 34–49)
Hemoglobin: 14.2 g/dL (ref 11.2–16.0)
MCV: 91 fL (ref 75–100)
Platelets: 228 10*3/uL (ref 150–450)
RBC: 4.7 MIL/uL (ref 4.0–5.5)
RDW: 12.2 % (ref 0.0–15.0)
WBC: 6.8 10*3/uL (ref 3.5–11.0)

## 2023-12-18 LAB — VITAMIN B12: Vitamin B12: 692 pg/mL (ref 232–1245)

## 2023-12-18 LAB — FOLATE: Folate: 11.5 ng/mL (ref >=4.6)

## 2023-12-18 LAB — CRP: CRP: <3 mg/L (ref 0–8)

## 2023-12-18 LAB — MAGNESIUM: Magnesium: 2.1 mg/dL (ref 1.6–2.5)

## 2023-12-19 LAB — ZINC: Zinc: 76 ug/dL (ref 60.0–120.0)

## 2023-12-24 LAB — 25 HYDROXYVITAMIN, D2&D3
25 Hydroxy D2: 5 ng/mL
25 Hydroxy D3: 26 ng/mL
Vitamin D 25 Hydroxy: 31 ng/mL

## 2023-12-31 ENCOUNTER — Other Ambulatory Visit: Payer: Self-pay

## 2024-01-28 ENCOUNTER — Telehealth: Payer: Self-pay

## 2024-01-28 NOTE — Telephone Encounter (Signed)
 Patient was called to confirm HBT scheduled for 02/04/2024. No answer, voicemail left reminding them of the dietary restrictions associated with their upcoming test. Voicemail left with number, [phone] for call back if they have any questions or did not receive instructions for test.    Upon call back, confirm date, time, and if patient has diet prep instructions.    Copy of HBT instructions  sent to patients MyChart/mailed to patient's home.

## 2024-01-31 ENCOUNTER — Telehealth: Payer: Self-pay

## 2024-01-31 NOTE — Telephone Encounter (Signed)
 NO VOICEMAIL SET UP TO LEAVE MESSAGE SENT PREP INSTRUCTIONS TO PATIENT MYCHART

## 2024-02-01 ENCOUNTER — Telehealth: Payer: Self-pay

## 2024-02-01 NOTE — Telephone Encounter (Unsigned)
 Copied from CRM #[id number]. Topic: Appointments - Reschedule Appointment  >> Feb 01, 2024  1:20 PM Judee [place] wrote:  Patient called to reschedule 6/9 HBT.    Patient is rescheduled to 8/12 at 8:10 AM.    Patient would like to be put on a cancellation list for a sooner appointment.    Patient can be reached at [phone]      .

## 2024-02-04 ENCOUNTER — Ambulatory Visit: Payer: PRIVATE HEALTH INSURANCE

## 2024-02-09 ENCOUNTER — Encounter: Payer: Self-pay | Admitting: Emergency Medicine

## 2024-02-12 ENCOUNTER — Ambulatory Visit: Payer: PRIVATE HEALTH INSURANCE | Attending: Obstetrics and Gynecology

## 2024-02-12 ENCOUNTER — Other Ambulatory Visit: Payer: Self-pay

## 2024-02-12 VITALS — BP 118/80 | Ht 64.0 in | Wt 154.0 lb

## 2024-02-12 DIAGNOSIS — Z01419 Encounter for gynecological examination (general) (routine) without abnormal findings: Secondary | ICD-10-CM

## 2024-02-12 NOTE — H&P (Signed)
 [facility]    Chief Complaint:     Gynecologic Exam (No issues)    Subjective:     Caroline Blanchard is a 33 y.o. female G0P0000 with LMP of 01/18/2024 who presents for an annual exam.     She is currently using natural family planning and using natural cycles for contraception. Reports monthly menses described as moderate flow that lasts 6 days. Denies dysmenorrhea.     She previously used a [place] IUD.      She is not currently sexually active.  She reports no problems with intercourse. She is not planning on getting pregnant in the next year. STI screening is ordered.  Patient notes no breast concerns today. Other gyn related symptoms noted today: none.    Since her last annual gyn exam she notes no significant recent changes in her general health.  Seeing therapist and psychiatrist - happy with her team and feeling really good!    Denies fhx of breast/uterine/ovarian or colorectal cancer    PAP: 05/2022 NILM repeat 2026    MVI - not currently  Exercise - running, walking and yoga  Denies safety concerns at home, work, or relationships.     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      Patient Active Problem List   Diagnosis Code    Generalized anxiety disorder with panic attacks F41.1, F41.0    MDD (major depressive disorder), recurrent episode, moderate F33.1    Dizziness R42    Dysphagia R13.10    POTS (postural orthostatic tachycardia syndrome) G90.A    Migraine without aura and without status migrainosus, not intractable G43.009    Fatigue R53.83    Anxiety state F41.1     Past Medical History:   Diagnosis Date    Anemia     Anxiety     Depression     Head injury     Headache     migraines    Migraines     Panic attack     Sleep difficulties      History reviewed. No pertinent surgical history.  Family History   Problem Relation Age of Onset    Anxiety disorder Mother     Depression Mother     Thyroid   disease Sister     Depression Sister     Anxiety disorder Sister     Depression Maternal Grandfather     Anxiety disorder Maternal Grandfather     Depression Maternal Grandmother     Anxiety disorder Maternal Grandmother     Diabetes Paternal Grandfather     Bipolar disorder Paternal Grandfather      Social History[1]        Objective:     BP 118/80    Ht 1.626 m (5' 4)   Wt 69.9 kg (154 lb)   BMI 26.43 kg/m           Physical Exam   Constitutional: She appears well-developed and well-nourished. No distress.   HENT:   Head: Normocephalic.   Cardiovascular: Normal heart sounds.   Pulmonary/Chest: Effort normal and breath sounds normal.   Breast:       Inspection: no asymmetry, skin changes or nipple retraction      Palpation: no mass or tenderness      Axillary/Clavicular lymph nodes: no lymphadenopathy present   Abdominal: Soft. There is no abdominal tenderness.   Genitourinary:  External genitalia: normal appearance for age, no lesion(s)       Vagina: normal vaginal vault no mass(es)       Cervix: normal in appearance  no lesion(s), tenderness or friability       Uterus:   no tenderness and normal contour        Adnexa: no tenderness or mass(es)        Perineum: normal in appearance   Psychiatric: She has a normal mood and affect.              Assessment:     Caroline Blanchard is a 33 y.o. female who presents for an annual exam.       Plan:      Health/Risk Assement:     - Reviewed healthy lifestyle choices  - Discussed breast self-awareness  Sexuality and Reproductive Planning:    - Current birth control method: natural family planning  - Patient is satisfied with current contraceptive use. Discussed non-hormonal option of copper IUD - she tried this in the past and discontinued d/t increased cramping, heavier periods. Encouraged condom use when sexually active.  Fitness and Nutrition:    - Physical activity: Patient counseled on importance of regular exercise.  - Supplements: Discussed the role of  multivitamin or prenatal vitamins for adequate folic acid intake. Reviewed the importance of calcium/vitamin D  and how to obtain adequate amounts through diet and/or supplementation.  Labs and other tests:    - Reviewed current Pap/HPV screening guidelines  - Last Pap 06/23/2022 with results negative cytology and HPV negative  - Patient declines STI screening  Return: Patient to return for AGY and/or as needed.     Annye Kinds, NP             [1]   Social History  Socioeconomic History    Marital status: Single   Tobacco Use    Smoking status: Never     Passive exposure: Never    Smokeless tobacco: Never   Vaping Use    Vaping status: Never Used   Substance and Sexual Activity    Alcohol use: Not Currently     Comment: LU 6 months ago    Drug use: Never    Sexual activity: Not Currently

## 2024-02-20 ENCOUNTER — Telehealth: Payer: Self-pay

## 2024-02-20 NOTE — Telephone Encounter (Signed)
 Patient was called to confirm HBT scheduled for 02/27/2024. No answer, voicemail left reminding them of the dietary restrictions associated with their upcoming test. Voicemail left with number, [phone] for call back if they have any questions or did not receive instructions for test.    Upon call back, confirm date, time, and if patient has diet prep instructions.    Copy of HBT instructions  sent to patients MyChart/mailed to patient's home.

## 2024-02-21 NOTE — Telephone Encounter (Signed)
 Copied from CRM [id number]. Topic: Return Call - Schedule Appointment  >> Feb 21, 2024  4:17 PM Delon HERO wrote:  Patient, Caroline Blanchard, returning our call to schedule EGD w/ Anesthesia.    Writer unable to schedule due to GA needed for procedure. Writer reached out to IBD back office to check on scheduling and warm transferred patient to [place] to schedule.    Patient can be reached if necessary at [phone].

## 2024-02-26 ENCOUNTER — Other Ambulatory Visit: Payer: Self-pay

## 2024-02-27 ENCOUNTER — Encounter: Payer: Self-pay | Admitting: Gastroenterology

## 2024-02-27 ENCOUNTER — Ambulatory Visit: Payer: PRIVATE HEALTH INSURANCE | Attending: Gastroenterology | Admitting: Medical

## 2024-02-27 DIAGNOSIS — R110 Nausea: Secondary | ICD-10-CM

## 2024-02-27 DIAGNOSIS — R142 Eructation: Secondary | ICD-10-CM

## 2024-02-27 DIAGNOSIS — R634 Abnormal weight loss: Secondary | ICD-10-CM

## 2024-02-27 LAB — HYDROGEN BREATH TEST - GLUCOSE 75 GRAMS P.O. X1

## 2024-02-27 NOTE — Progress Notes (Signed)
 Patient completed Glucose HBT. Patient tolerated test well; patient had taken extra time to drink glucose solution (12 minutes).

## 2024-02-27 NOTE — Progress Notes (Signed)
Patient arrived for Glucose HBT. Patient was afebrile. Patient followed proper prep. Glucose carbohydrate 75g administered. Patient tolerated well. Final results to be sent to ordering provider within 7-10 days.

## 2024-03-06 NOTE — Telephone Encounter (Unsigned)
 Copied from CRM #[id number]. Topic: Return Call - Schedule Appointment  >> Mar 06, 2024  2:45 PM Maura SAUNDERS wrote:  Patient called in regards of rescheduling EGD with Dr. Janella  Patient advised she can't make it and would like a wednesday morning if possible   Writer unable to reschedule   Writer reached out to back office was advised to warm transfer

## 2024-03-14 ENCOUNTER — Telehealth: Payer: Self-pay | Admitting: Gastroenterology

## 2024-03-14 NOTE — Telephone Encounter (Signed)
 Called and spoke with pt, scheduled EGD w GA 04/17/2024 8:15am [facility]  -prep sent via mychart

## 2024-03-14 NOTE — Telephone Encounter (Unsigned)
 Copied from CRM #[id number]. Topic: Appointments - Appointment Information  >> Mar 14, 2024 10:02 AM Rollo BIRCH wrote:  Caroline Blanchard, Patient, is calling.  Patient has a scheduled appointment on 07/16/24 with [facility]. The patient would like a sooner appointment if possible to be seen for EGD with GA. Patient can be reached at [phone] .     Writer reached out to back line, no answer.    Please advise and reach out.

## 2024-03-24 ENCOUNTER — Encounter: Payer: Self-pay | Admitting: Gastroenterology

## 2024-03-28 ENCOUNTER — Telehealth: Payer: Self-pay

## 2024-03-28 NOTE — Telephone Encounter (Signed)
 Attempted to call patient to discuss mychart message from provider:    Please call patient: The pantoprazole  does not come in a capsule at this time, it comes a liquid suspension but it appears that most insurance companies will not cover the liquid as it is very expensive and their are less expensive alternatives. I apologize for the confusion.   Your options are to try a different PPI medication that you have not tried before or we can try some Maalox liquid until her EGD.    Can you please clarify which PPIs/antacids she has tried and failed and what the side effects were. Also clarify if she did not tolerate zofran and what the undesirable effects were.     No answer. Left voicemail and sending mychart message.

## 2024-04-02 NOTE — Telephone Encounter (Signed)
 Pt has read my chart message, encounter closed.

## 2024-04-03 ENCOUNTER — Telehealth: Payer: Self-pay

## 2024-04-03 NOTE — Telephone Encounter (Signed)
 Spoke with patient and advised the arrival time for the EGD with Dr Jeni on 8/21 has changed to 7:30am. She was fine with it.

## 2024-04-10 MED ORDER — LANSOPRAZOLE 15 MG PO TBDP *I*
15.0000 mg | ORAL_TABLET | Freq: Every day | ORAL | 1 refills | Status: DC
Start: 2024-04-10 — End: 2024-04-15

## 2024-04-10 NOTE — Addendum Note (Signed)
 Addended by: BONNIE PFEIFFER on: 04/10/2024 12:25 PM Modules accepted: Orders

## 2024-04-10 NOTE — Telephone Encounter (Signed)
 Rx lansoprazole  sent in to pharmacyCourtney Bonnie, NP

## 2024-04-11 ENCOUNTER — Telehealth: Payer: Self-pay

## 2024-04-11 ENCOUNTER — Other Ambulatory Visit: Payer: Self-pay | Admitting: Gastroenterology

## 2024-04-14 NOTE — Telephone Encounter (Signed)
Added to provider waitlist.

## 2024-04-15 MED ORDER — LANSOPRAZOLE 15 MG PO CPDR *I*
15.0000 mg | DELAYED_RELEASE_CAPSULE | Freq: Every day | ORAL | 2 refills | Status: DC
Start: 2024-04-15 — End: 2024-06-13

## 2024-04-15 NOTE — Addendum Note (Signed)
 Addended by: Merie Wulf on: 04/15/2024 12:31 PM Modules accepted: Orders

## 2024-04-16 ENCOUNTER — Ambulatory Visit: Payer: PRIVATE HEALTH INSURANCE

## 2024-04-16 ENCOUNTER — Other Ambulatory Visit: Payer: Self-pay

## 2024-04-16 VITALS — BP 119/75 | HR 75 | Temp 96.8°F | Resp 18

## 2024-04-16 DIAGNOSIS — X500XXA Overexertion from strenuous movement or load, initial encounter: Secondary | ICD-10-CM | POA: Insufficient documentation

## 2024-04-16 DIAGNOSIS — M542 Cervicalgia: Secondary | ICD-10-CM | POA: Insufficient documentation

## 2024-04-16 MED ORDER — KETOROLAC TROMETHAMINE 30 MG/ML IJ SOLN *I*
15.0000 mg | Freq: Once | INTRAMUSCULAR | Status: AC
Start: 2024-04-16 — End: 2024-04-16
  Administered 2024-04-16: 15 mg via INTRAMUSCULAR

## 2024-04-16 MED ORDER — LIDOCAINE 5 % EX PTCH *I*
1.0000 | MEDICATED_PATCH | CUTANEOUS | 0 refills | Status: AC
Start: 2024-04-16 — End: 2024-04-28

## 2024-04-16 NOTE — Patient Instructions (Signed)
 Continue with naproxen (with your omeprazole), tylenol, flexeril  (at bedtime) and lidocaine  patches for pain improvement. Use heat and gentle stretching at home. A referral to the orthopedic doctor was provided for further evaluation. Return to urgent care if you develop new or worsening symptoms.
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
## 2024-04-17 ENCOUNTER — Telehealth: Payer: Self-pay | Admitting: Gastroenterology

## 2024-04-17 ENCOUNTER — Telehealth: Payer: Self-pay

## 2024-04-17 ENCOUNTER — Ambulatory Visit: Payer: PRIVATE HEALTH INSURANCE | Admitting: Gastroenterology

## 2024-04-17 NOTE — Telephone Encounter (Signed)
 Copied from CRM #[id number]. Topic: Appointments - Reschedule Appointment>> Apr 17, 2024  2:27 PM Rollo BIRCH wrote:Caroline Blanchard, patient,  is calling to re-schedule her EGD with GA.Writer see's patient was suppose to have this done today 04/17/24 but no showed due to falling ill.Reached out to [facility] , advised nothing open right now send a message. Patient can be reached at [phone].

## 2024-04-17 NOTE — Telephone Encounter (Addendum)
 Arrival time was for 8:00 am. Patient has not checked in. Writer called and spoke to patient. Patient told writer she had went to urgent care last night and they gave  her a Toradol  shot.  Patient stated she now going diarrheaand had decided not to come in for  her procedure. Procedure date: 8/21/25Procedure: EGD w/GAProvider: Bartell  Writer gave the [phone] to reschedule the procedure.

## 2024-04-23 ENCOUNTER — Other Ambulatory Visit: Payer: Self-pay

## 2024-04-23 NOTE — Telephone Encounter (Signed)
 Patient's chart was previously reviewed by CPM on 8/21. No need to re-review.Will send message to patient to make aware to hold vitamins/supplements day of procedure.

## 2024-04-23 NOTE — Telephone Encounter (Signed)
 Spoke with pt through Mychart and re scheduled her EGD W GA on 8/28 with Dr Jeni with a 9am arrival. No ac holdPrep sent to Mychart

## 2024-04-24 ENCOUNTER — Encounter: Payer: Self-pay | Admitting: Gastroenterology

## 2024-04-24 ENCOUNTER — Ambulatory Visit: Payer: PRIVATE HEALTH INSURANCE | Admitting: Anesthesiology

## 2024-04-24 ENCOUNTER — Ambulatory Visit
Admission: RE | Admit: 2024-04-24 | Discharge: 2024-04-24 | Disposition: A | Discharge status: Home / Self Care | Payer: PRIVATE HEALTH INSURANCE | Source: Ambulatory Visit | Attending: Gastroenterology | Admitting: Gastroenterology

## 2024-04-24 DIAGNOSIS — R1013 Epigastric pain: Secondary | ICD-10-CM

## 2024-04-24 DIAGNOSIS — R1310 Dysphagia, unspecified: Secondary | ICD-10-CM | POA: Insufficient documentation

## 2024-04-24 DIAGNOSIS — K219 Gastro-esophageal reflux disease without esophagitis: Secondary | ICD-10-CM | POA: Insufficient documentation

## 2024-04-24 DIAGNOSIS — R110 Nausea: Secondary | ICD-10-CM | POA: Insufficient documentation

## 2024-04-24 DIAGNOSIS — K2950 Unspecified chronic gastritis without bleeding: Secondary | ICD-10-CM | POA: Insufficient documentation

## 2024-04-24 DIAGNOSIS — Z3202 Encounter for pregnancy test, result negative: Secondary | ICD-10-CM

## 2024-04-24 LAB — POCT URINE PREGNANCY
Lot #: 896420
Preg Test,UR POC: NEGATIVE

## 2024-04-24 MED ORDER — LACTATED RINGERS IV SOLN *I*
100.0000 mL/h | INTRAVENOUS | Status: DC
Start: 2024-04-24 — End: 2024-04-25
  Administered 2024-04-24: 100 mL/h via INTRAVENOUS

## 2024-04-24 MED ORDER — PROPOFOL 10 MG/ML IV EMUL (INTERMITTENT DOSING) WRAPPED *I*
INTRAVENOUS | Status: DC | PRN
Start: 2024-04-24 — End: 2024-04-24
  Administered 2024-04-24: 30 mg via INTRAVENOUS
  Administered 2024-04-24: 50 mg via INTRAVENOUS
  Administered 2024-04-24: 70 mg via INTRAVENOUS
  Administered 2024-04-24: 30 mg via INTRAVENOUS

## 2024-04-24 MED ORDER — DEXMEDETOMIDINE HCL 200 MCG/2ML IV SOLN WRAPPED *I*
INTRAVENOUS | Status: DC | PRN
  Administered 2024-04-24: 8 ug via INTRAVENOUS
  Administered 2024-04-24: 4 ug via INTRAVENOUS

## 2024-04-24 MED ORDER — MIDAZOLAM HCL 1 MG/ML IJ SOLN (ANESTHESIA USE ONLY) *I*
INTRAMUSCULAR | Status: DC | PRN
  Administered 2024-04-24: 2 mg via INTRAVENOUS

## 2024-04-24 MED ORDER — PROPOFOL INFUSION 10 MG/ML *I*
INTRAVENOUS | Status: DC | PRN
Start: 2024-04-24 — End: 2024-04-24
  Administered 2024-04-24: 200 ug/kg/min via INTRAVENOUS
  Administered 2024-04-24: 175 ug/kg/min via INTRAVENOUS
  Administered 2024-04-24: 250 ug/kg/min via INTRAVENOUS

## 2024-04-24 MED ORDER — MIDAZOLAM HCL 1 MG/ML IJ SOLN *I* WRAPPED
INTRAMUSCULAR | Status: AC
Start: 2024-04-24 — End: 2024-04-24
  Filled 2024-04-24: qty 2

## 2024-04-24 MED ORDER — LIDOCAINE HCL 2 % IJ SOLN *I*
INTRAMUSCULAR | Status: DC | PRN
Start: 2024-04-24 — End: 2024-04-24
  Administered 2024-04-24: 60 mg via INTRAVENOUS

## 2024-04-24 NOTE — Anesthesia Case Conclusion (Signed)
CASE CONCLUSION  Emergence  Criteria Used for Airway Removal:  Adequate Tv & RR and acceptable O2 saturation  Assessment:  Routine  Transport  Directly to: PACU  Airway:  Nasal cannula  Oxygen Delivery:  6 lpm  Position:  Lateral  Patient Condition on Handoff  Level of Consciousness:  Deeply sedated/GA  Patient Condition:  Stable  Handoff Report to:  RN

## 2024-04-24 NOTE — Anesthesia Postprocedure Evaluation (Signed)
 Anesthesia Post-Op NotePatient: Caroline CatchpoleProcedure(s) Performed:Procedure SummaryDate:04/24/2024 Anesthesia Start: 04/24/2024 11:45 AM Anesthesia Stop: 04/24/2024 12:09 PM Room / Location:* No operating room entered * / [facility] GI PROCEDURES * No procedures listed * Diagnosis:* No pre-op diagnosis entered * Surgeon(s):Bartell, Mabel Cage, Anuroop, MBBS Responsible Anesthesia Provider:Amandalee Lacap, MD Recovery VitalsBP: 96/58 (04/24/2024 12:20 PM)Heart Rate: 104 (04/24/2024 12:20 PM)Resp: 18 (04/24/2024  9:31 AM)Temp: 36.3 C (97.3 F) (04/24/2024 12:08 PM)SpO2: 100 % (04/24/2024 12:20 PM)O2 Flow Rate: 4 L/min (04/24/2024 12:08 PM)0-10  Pain Scale: 0 (04/24/2024 12:20 PM)Anesthesia type:generalComplications Noted During Procedure or in PACU:None Comment:  Patient Location:PACULevel of Consciousness:  AwakeTemperature Status:  NormothermicOxygen Saturation:  Within patient's normal rangeCardiac Status: within patient's normal rangeFluid Status:  EuvolemicAirway Patency:   YesPulmonary Status:  BaselinePain Management:  Adequate analgesiaNausea and Vomiting:None  Post Op Assessment:  Tolerated procedure wellResponsible Anesthesia Provider Attestation:All indicated post anesthesia care provided -

## 2024-04-24 NOTE — Anesthesia Procedure Notes (Signed)
---------------------------------------------------------------------------------------------------------------------------------------  AIRWAY GENERAL INFORMATION AND STAFF  Patient location during procedure: Procedure Rm     Date of Procedure: 04/24/2024 11:53 AMCONDITION PRIOR TO MANIPULATION   Current Airway/Neck Condition:  Normal      For more airway physical exam details, see Anesthesia PreOp EvaluationAIRWAY METHOD   Patient Position:  Lateral  Preoxygenated: yes    Maintained In-Line Stability: not needed, normal c-spine condition        To see details of medications used, see MAR  Induction: IV  Mask Difficulty Assessment:  0 - not attemptedFINAL AIRWAY DETAILS  Final Airway Type:  Nasal cannula  Airway adjunct type: Plastic bite block.  Head position required to avoid obstruction:  Mild neck extension  Insertion Site:  Left naris and right naris----------------------------------------------------------------------------------------------------------------------------------------

## 2024-04-24 NOTE — Anesthesia Preprocedure Evaluation (Signed)
 Anesthesia Pre-operative History and Physical for Caroline CatchpoleHighlighted Issues for this Procedure:33 y.o. female for EGDStress Test/Echocardiography:===   ===ECHO COMPLETE 10/08/2023- Interpretation Summary -Normal left ventricular size and function with no regional wall motion abnormalities. Calculated LVEF is 65%.Normal right ventricular size and function.No significant valvular abnormalities.No prior study for comparison.RYAN NELSON, MD .Anesthesia Evaluation   NEURO/PSYCH/ORTHO  + Headaches  + Neuropsychiatric Issues        anxiety, depression Physical ExamAirway          Mallampati: II          TM distance (fb): >3 FB          TM distance (cm): 3          Neck ROM: full Cardiovascular  Normal ExamNeurologic    Normal Exam Pulmonary   Normal ExamMental Status   Normal Exam ________________________________________________________________________PLANASA Score  2Anesthetic Plan MAC; Monitoring (standard ASA); PONV Plan (dexamethasone, ondansetron and propofol  infusion); PostOp (PACU)Standard AttestationInformed Consent   Risks:        Risks discussed were commensurate with the plan listed above with the following specific points: N/V, aspiration, sore throat and emergence delirium, Damage to: eyes and teeth, allergic Rx.  Anesthetic Consent:       Anesthetic plan (and risks as noted above) were discussed with patient  Plan also discussed with team members including:     CRNAResponsible Anesthesia Provider Attestation:I attest that the patient or proxy understands and accepts the risks and benefits of the anesthesia plan. I also attest that I have personally performed a pre-anesthetic examination and evaluation, and prescribed the anesthetic plan for this particular location within 48 hours prior to the anesthetic as documented. Theo Genera, MD  04/24/24, 9:18  AM

## 2024-04-24 NOTE — Procedures (Addendum)
 EGD Procedure Note Date of Procedure: 8/28/2025Referring Provider: Stewart Warren LABOR, NP Primary Care Provider: Girard Poe, NP Attending Physician: Mabel Merino, MDFellow: Ivie Areas, MBBSIndication(s): Dysphagia, GERD.Medications: Agents given by the anesthesia service Endoscope: [id number]:Biopsy forceps: Regular cold biopsy Procedure Description: Full disclosure of risks were reviewed with patient as detailed on the consent form. The patient was placed in the left lateral decubitus position and monitored with continuous pulse oximetry and ECG tracing, interval blood pressure monitoring and direct observations. After adequate sedation, the endoscope was carefully introduced into the oropharynx and passed in to the esophagus under direct visualization. The esophagus and GE junction were carefully examined, including a measurement of the Z-line at 36 cm, GEJ at 36 cm and hiatus at 36 cm from the incisors. After advancement of the endoscope into the stomach, a careful examination was performed, including views of the antrum, incisura angularis, corpus and retroflexed views of the cardia and fundus. The pylorus was then intubated without any difficulty and the endoscope was advanced to the second portion of the duodenum. Careful examination of the second portion of the duodenum and the bulb was then performed.  The stomach was then decompressed and the endoscope withdrawn. Findings and intervention(s) are detailed below. The patient was recovered in the GI recovery areaFindings:   Esophagus:Normal tubular esophagus.Sharp Z-line No erythema, erosions or ulcerations noted in the esophagus.GEJ at 36 cm. Biopsies of the mid and distal esophagus taken.  Stomach:Insufflated appropriately and examined in forward and retroflexed views.No evidence of erythema, edema, erosions or ulceration seen in the stomach.Gastric biopsies taken.  Duodenum/small  bowel:Examined up to D2.Normal without any erythema, erosions or ulcerations noted throughout the visualized portions.Intervention(s):Cytology/biopsy: Biopsies taken of esophagusof stomachComplication(s):noneEBL: < 2 mlImpression(s):Normal exam up to D2. Gastric and mid/distal esophageal biopsies obtainedRecommendation(s):Await pathology resultsFollow-up in GI clinic with Warren Stewart, NP as scheduledFollow-up with PCPAnuroop Areas BUSSING, Gastroenterology and Hepatology Fellow GI ATTENDINGI was present for the entire viewing portion of the exam and participated as needed throughout the procedure, and I concur with the findings and intervention descriptions as outlined in this edited note.MABEL MERINO, MD

## 2024-04-24 NOTE — Discharge Instructions (Addendum)
 GASTROENTEROLOGY UNITDISCHARGE INSTRUCTIONS FOR GASTROSCOPY8/28/2025                12:09 PMEGD and Biopsies takenDo not drive, operate heavy machinery, drink alcoholic beverages, make important personal or business decisions, or sign legal documents until the next day. Return to your usual medicationsReturn to your usual dietThings You May Expect:A mild sore throat (Warm liquids or lozenges will soothe the throat)You were given medication to help you relax during the test. You need to rest at home for at least 4-6 hours.You Should Call Your Doctor For Any of the Following:Fever, abdominal or chest pain that does not go awayCough or trouble breathingBloody or black stools.Pain or redness at the IV siteIf you have a serious problem after hours.Call [phone] to reach the GI physician on call.If you are unable to reach your doctor, go to the [facility] Emergency Department.Follow Up Care:If biopsies were taken during your procedure, we will send you the pathology results within 7-10 days. If you do not receive your pathology results after 10 days please call [phone] will be sent to your primary doctor.Follow-up in GI clinic with Warren Schiller, NP, to be scheduled.New Prescriptions  No medications on file

## 2024-04-24 NOTE — Preop H&P (Signed)
 OUTPATIENT PRE-PROCEDURE H&PChief Complaint / Indications for Procedure: Dysphagia, GERD, nauseaPast Medical History: Past Medical History[1]Past Surgical History[2]Family History[3]Social History Socioeconomic History  Marital status: Single Tobacco Use  Smoking status: Never   Passive exposure: Never  Smokeless tobacco: Never Vaping Use  Vaping status: Never Used Substance and Sexual Activity  Alcohol use: Not Currently   Comment: LU 6 months ago  Drug use: Never  Sexual activity: Not Currently Allergies:  Allergies[4]Medications:(Not in a hospital admission)  Current Outpatient Medications Medication  lidocaine  (LIDODERM ) 5 % patch  lansoprazole  (PREVACID ) 15 MG capsule  hydrOXYzine HCl 25 mg tablet  ubrogepant (UBRELVY) 100 mg tablet  SUMAtriptan  (IMITREX ) 50 mg tablet  hydrOXYzine HCl 10 mg tablet  lamoTRIgine  100 mg tablet  loratadine (CLARITIN) 10 mg tablet  magnesium oxide (MAG-OX) 400 (241.3 mg) mg tablet  Ginger, Zingiber officinalis, (GINGER PO)  carboxymethylcellulose-glycerin (REFRESH OPTIVE) 0.5-0.9 % SOLN ophthalmic solution  EPINEPHrine (EPIPEN) 0.3 mg/0.3 mL auto-injector Current Facility-Administered Medications Medication Dose Route Frequency  Lactated Ringers  Infusion  100 mL/hr Intravenous Continuous There were no vitals filed for this visit.ROSPhysical Examination:Head/Nose/Throat:negativeLungs:Lungs clearCardiovascular:normal S1 and S2Abdomen: abdomen soft, non-tender, nondistended, normal active bowel sounds, no masses or organomegalyLab Results: noneRadiology impressions (last 30 days):No results found.Currently Active Problems:Patient Active Problem List Diagnosis Code  Generalized anxiety disorder with panic attacks F41.1, F41.0  MDD (major depressive disorder), recurrent episode, moderate F33.1  Dizziness R42  Dysphagia R13.10  POTS (postural  orthostatic tachycardia syndrome) G90.A  Migraine without aura and without status migrainosus, not intractable G43.009  Fatigue R53.83  Anxiety state F41.1  Impression:Dysphagia, GERD, nauseaPlan:EGDMACUPDATES TO PATIENT'S CONDITION on the DAY OF SURGERY/PROCEDUREI. Updates to Patient's Condition (to be completed by a provider privileged to complete a H&P, following reassessment of the patient by the provider):Full H&P done today; no updates needed.  II. Procedure Readiness I have reviewed the patient's H&P and updated condition. By completing and signing this form, I attest that this patient is ready for surgery/procedure.III. Attestation I have reviewed the updated information regarding the patient's condition and it is appropriate to proceed with the planned surgery/procedure.Hydia Copelin Aldona, MBBS as of 9:18 AM 04/24/2024  [1] Past Medical History:Diagnosis Date  Anemia   Anxiety   Depression   Head injury   Headache   migraines  Migraines   Panic attack   Sleep difficulties  [2] No past surgical history on file.[3] Family HistoryProblem Relation Name Age of Onset  Anxiety disorder Mother    Depression Mother    Thyroid  disease Sister    Depression Sister    Anxiety disorder Sister    Depression Maternal Grandfather    Anxiety disorder Maternal Grandfather    Depression Maternal Grandmother    Anxiety disorder Maternal Grandmother    Diabetes Paternal Grandfather    Bipolar disorder Paternal Grandfather   [4] AllergiesAllergen Reactions  Seasonal Allergies Rhinitis    congesttion

## 2024-04-25 ENCOUNTER — Telehealth: Payer: Self-pay

## 2024-04-25 NOTE — Telephone Encounter (Signed)
 Didn't speak with Caroline Blanchard , patient, at check out: Patient was seen by Dr. Jeni today and the Provider's recommendations were: FUV with Warren Schiller NP, next available. . Scheduled all recommended appts as directed in check out comment.AVS provided to patient via discharge.

## 2024-04-29 ENCOUNTER — Encounter: Payer: Self-pay | Admitting: Gastroenterology

## 2024-04-29 ENCOUNTER — Telehealth: Payer: Self-pay

## 2024-04-29 DIAGNOSIS — R110 Nausea: Secondary | ICD-10-CM

## 2024-04-29 DIAGNOSIS — R1084 Generalized abdominal pain: Secondary | ICD-10-CM

## 2024-04-29 DIAGNOSIS — R1013 Epigastric pain: Secondary | ICD-10-CM

## 2024-04-29 NOTE — Telephone Encounter (Signed)
 Pt calls on nurse line, she would like to come in to discuss possible birth control options as she has been having intense cramps and constipation and nausea when ovulation. Pt has had a workup with GI and all was negative.  Pt scheduled for appointment to discuss.  MKellyRN

## 2024-04-29 NOTE — Telephone Encounter (Signed)
 Web Page: Cordie and spoke to [place] with primary complaint of stomach cramping, nausea, constipation.Duration of symptoms several monthsAssociated symptoms: N/AFever? NoneNausea/Vomiting? nauseaFrequency: 2 timesHeartburnNoAbdominal Pain  The patient admits to abdominal pain located in the epigastrium. The pain is described as pressure-like and is  6/10 in intensity.nonradiatingAggravated by: n/aAlleviated by: nothingAbdominal Bloating NoBowels  Stool color: brownDescription: softFrequency: couple stools/day after flare-upsPassing Gas YesPatient currently on prescription GI medication?  yes  Miralax PRNCompliant with medication? YesOTC Meds? noAre they working? N/AOther notes: Pt states on Saturday she had extreme cramping and pressure like pain epigastric area and under both ribs.  She also has nausea, and constipation. She took Miralax a capful in the evening and she did have a bowel movement Yesterday and today.  She says after these flare-ups she has a few bowel movements, with Mucous.  She says she has noticed in last two months she has these episodes when she is ovulating, she says her ovulation pain has worsened.  She took lansoprazole  two days and stopped taking because she says it caused waves of nausea and cramping.

## 2024-04-30 ENCOUNTER — Other Ambulatory Visit: Payer: Self-pay

## 2024-04-30 ENCOUNTER — Ambulatory Visit: Payer: PRIVATE HEALTH INSURANCE | Attending: Obstetrics | Admitting: Obstetrics and Gynecology

## 2024-04-30 ENCOUNTER — Telehealth: Payer: Self-pay

## 2024-04-30 VITALS — BP 116/68 | Ht 64.0 in | Wt 144.0 lb

## 2024-04-30 DIAGNOSIS — N940 Mittelschmerz: Secondary | ICD-10-CM

## 2024-04-30 DIAGNOSIS — Z30430 Encounter for insertion of intrauterine contraceptive device: Secondary | ICD-10-CM

## 2024-04-30 DIAGNOSIS — Z30012 Encounter for prescription of emergency contraception: Secondary | ICD-10-CM

## 2024-04-30 MED ORDER — DIAZEPAM 10 MG PO TABS *I*
10.0000 mg | ORAL_TABLET | Freq: Once | ORAL | 0 refills | Status: AC
Start: 2024-04-30 — End: 2024-04-30

## 2024-04-30 NOTE — Progress Notes (Signed)
 [facility] AssociatesChief Complaint: Follow-up (Pt was curious about IUD; intense pressure and urge to urinate when ovulating)Subjective: Caroline Blanchard is a 33 y.o. female G0P0000 with LMP of 04/11/2024 who presents to the office with a few different concerns. Her main concern today is that she had unprotected intercourse over the weekend and is not on any form of birth control. States unprotected intercourse was on Sunday and Monday. Admits they did use a condom but was unprotected at certain points. She would like to discuss emergency contraception. She is wondering about Paragard  IUD. She currently tracks her cycles and ovulation and had a positive LH test on the 29th of this month (2 days prior to unprotected intercourse). She also expresses concern for ovulatory discomfort. States for the past 4-5 months she has noticed more cramping with ovulation which is also associated with constipation and feeling like she needs to urinate frequently. She had thought her sx may be GI related but has seen a GI provider and has underwent evaluation including upper endoscopy and SIBO testing. States she has other associated GI sx such as constant nausea and irregular bowel movements. She is wondering if there would be a birth control option that would be helpful for these symptoms. She states she has tried several forms of birth control in the past but has had unwanted side effects with them. Has tried Mirena IUD and Kyleena in the past but had weight gain, acne, and absent menses with this. Did have Paragard  IUD as well and thinks that she had this form in the longest. Along with cramping with ovulation she has noticed more mood changes. Hx of anxiety/depression at baseline and is currently on Lamictal . States she is used to having pre-menstrual mood changes but had not previously experienced this with ovulation.  Objective: BP 116/68  Ht 1.626 m (5' 4)   Wt 65.3 kg  (144 lb)   BMI 24.72 kg/m Physical Exam Constitutional: She appears well-developed and well-nourished. No distress. HENT: Head: Normocephalic and atraumatic. Pulmonary/Chest: Effort normal. Psychiatric: She appears anxious  Assessment: Pt is a 32yo F presenting to discuss emergency contraception and ovulatory discomfort as above.  Plan:  1) Emergency Contraception- discussed options for emergency contraception including Plan B, Ella, Paragard  IUD, Mirena IUD (though not FDA approved). Advised that as she likely already ovulated prior to having unprotected intercourse Plan B and Toy may not be as effective as these methods work by delaying ovulation. She is most interested in Paragard  IUD. Discussed common side effects of painful and heavy menses with Paragard  IUD. She verbalized understanding and had stated she would like to move forward with insertion. IUD tray was set up and pt taken for UPT which was negative, but during this states she became very nauseous and anxious and decided she did not want to move forward with Paragard  IUD placement today. She wonders if she can come back tomorrow to have this done and use pre-procedural valium  to help with anxiety. Advised as she will still be in the 5 day window of unprotected sex we could re-schedule for tomorrow. Will send valium . Aware to take this 1 hr prior to the procedure and will have a driver to/from. She verbalized understanding and agrees to plan. 2)  Ovulatory discomfort/cramping- discussed options for management. As above pt is having Paragard  IUD placed for emergency contraception. She is aware that Paragard  IUD will not help with her mid-cycle symptoms as this is non-hormonal and will not effect ovulatory function. Advised the  best option to treat her mid-cycle symptoms would be something systemic such as an OCP to suppress ovulatory function. As above she has tried hormonal birth control in the past and  has had unwanted SE so is hesitant about this. She describes side effects of acne, weight gain, mood changes, etc. Advised a good option for her may be Slynd as this contains Drosperinone only which can be helpful for acne and may not lead to other side effects such as mood changes or AUB. She would like to try this but will first schedule Paragard  IUD for emergency contraception, then will reach out if she would like to try Slynd. She could also try a combination OCP - has hx of migraines but no aura. Jinnie Cleaves, PA [facility]

## 2024-04-30 NOTE — Telephone Encounter (Signed)
 Attempted to call patient with message from provider in mychart:-start a food diary for 2 weeks and document everything you eat and the time you ate it and every symptom you have and the time of the symptom to discuss at the next visit.- a HIDA scan  is ordered which will look at biliary abnormalities that could cause symptoms, such as bile not being released as it should. Please call radiology at [phone] to schedule this imaging. -please update blood work during a time of acute abdominal pain to check for pancreas and liver numbers with any [facility] lab. Providers want the labs while you are having the symptoms.-your provider will check if they can get you in sooner but they don't think they have any available appointments and we can add you to a cancellation list should a sooner appointment become available.- you can take miralax as needed, titrating(going up or down on the dose as needed) from 1/2 capful to 2 capfuls daily with the goal of 1-2 softly formed stools a day. No answer. Left voicemail and sending mychart response.

## 2024-05-01 ENCOUNTER — Ambulatory Visit: Payer: Self-pay | Attending: Obstetrics | Admitting: Obstetrics and Gynecology

## 2024-05-01 DIAGNOSIS — Z30430 Encounter for insertion of intrauterine contraceptive device: Secondary | ICD-10-CM

## 2024-05-01 LAB — POCT URINE PREGNANCY
Lot #: 82722002
Preg Test,UR POC: NEGATIVE

## 2024-05-01 LAB — SURGICAL PATHOLOGY

## 2024-05-01 MED ORDER — PARAGARD INTRAUTERINE COPPER IU IUD *I*
1.0000 | INTRAUTERINE_SYSTEM | Freq: Once | INTRAUTERINE | Status: AC | PRN
Start: 2024-05-01 — End: 2024-05-01
  Administered 2024-05-01: 1

## 2024-05-01 NOTE — Patient Instructions (Signed)
 You had Paragard  IUD inserted today. Please avoid intercourse or using tampons for 1 week. Please remember that spotting and cramping can be normal after placement and should improve with time. Please call if you have severe pain or heavy bleeding (changing a pad/tampon every hour). You can alternate Ibuprofen  (600mg  every 6 hours) and Tylenol (500mg  every 6 hours) for pain.

## 2024-05-01 NOTE — Telephone Encounter (Signed)
 Pt reviewed my chart message relaying advisement.

## 2024-05-01 NOTE — Procedures (Signed)
 IUD insertDate/Time: 05/01/2024  1:00 PM EDTPerformed by: Teresa Jinnie Botts, Caroline Blanchard is a 33 y.o. G0P0000 who requests insertion of an IUD.  The device is being inserted due to the need for contraception.Urine pregnancy test 05/01/2024: NegativeConsent: Consent obtained:  WrittenConsent given by:  PatientThe procedure risks, benefits, complications and possible alternatives were discussed with the patient.  All questions were answered prior to the patient giving informed consent.Pre-procedure:  Chaperone present:  YesChaperone Name:  Pieri, AlexaProcedure DetailsThe patient was placed in dorsal lithotomy position. The uterus was sounded to 7 cm. Using the applicator, the IUD was successfully placed and floated to the fundus without difficulty. The string was cut to 2 cm from the cervix. The single tooth tenaculum  was removed without incident. Bleeding was minimal. Assessment:Insertion of 1 each paraGard  intrauterine copper  IUD placement.Plan: Post insertion instructions were reviewed with the patient All questions were answered and the patient stated a goodunderstanding of the instructions.Patient will monitor for heavy bleeding, fever, expulsion or severe pain.Patient will follow-up in  6  weeks for string check. Jinnie Teresa, PA [facility]

## 2024-05-02 ENCOUNTER — Ambulatory Visit: Payer: Self-pay | Admitting: Gastroenterology

## 2024-05-02 NOTE — Result Encounter Note (Signed)
 Pathology result from EGD reviewed. FINAL DIAGNOSIS: A.  Stomach, biopsy: - Minimal chronic inactive gastritis. - No Helicobacter organisms identified on H&E-stained sections.  B.  Esophagus, distal, biopsy: - Squamous mucosa with no significant histologic abnormality.  C.  Esophagus, mid, biopsy: - Squamous mucosa with no significant histologic abnormality. - Intraepithelial eosinophils are rare to absent. No significant abnormalities identified. Plan to follow-up in GI clinic with Warren Schiller, NP.MyChart message sent to patient. Results routed to PCP.

## 2024-05-22 ENCOUNTER — Other Ambulatory Visit: Payer: Self-pay

## 2024-05-23 ENCOUNTER — Encounter: Payer: Self-pay | Admitting: Physical Medicine and Rehabilitation

## 2024-05-23 ENCOUNTER — Ambulatory Visit
Admission: RE | Admit: 2024-05-23 | Discharge: 2024-05-23 | Disposition: A | Discharge status: Home / Self Care | Payer: PRIVATE HEALTH INSURANCE | Source: Ambulatory Visit

## 2024-05-23 ENCOUNTER — Ambulatory Visit: Payer: PRIVATE HEALTH INSURANCE | Admitting: Physical Medicine and Rehabilitation

## 2024-05-23 ENCOUNTER — Ambulatory Visit: Payer: Self-pay | Admitting: Physical Medicine and Rehabilitation

## 2024-05-23 VITALS — BP 115/74 | HR 88 | Ht 64.0 in | Wt 141.9 lb

## 2024-05-23 DIAGNOSIS — M50122 Cervical disc disorder at C5-C6 level with radiculopathy: Secondary | ICD-10-CM

## 2024-05-23 DIAGNOSIS — R202 Paresthesia of skin: Secondary | ICD-10-CM

## 2024-05-23 DIAGNOSIS — M542 Cervicalgia: Secondary | ICD-10-CM

## 2024-05-23 NOTE — Patient Instructions (Signed)
 Shoulder exercises:    Chin Tucks: While standing or sitting comfortably, bring your chin inwards toward your neck while bringing the back of your head back and up. Hold this position for 3-5 seconds, then relax. Do 3 sets of 5-10 repetitions.        Pendulum swings:  Lean forward and rest the arm that is not injured on a table. Do not round your back or lock your knees during the exercise. Let your other arm hang freely by your side. Gently swing your injured arm forward and backward, side to side, and in circles.          Shrugs:  Stand with your arms by your side. Gently lift your shoulders up to your ears and hold for 5 seconds. With your shoulders lifted, pinch your shoulder blades together. Hold for 5 seconds. Slowly return to the starting position.         Prone I, T, W, Y: Lay face-down on a mat or firm bed. Bring your arms over your head and raise them up off the floor in an "I" position (1), hold for 30 seconds and slowly return to the floor. Repeat with the "T" position with your arms out to the sides (2), the "W" position with elbows bent (3), and the "Y" position with arms overhead but away from the ears (4). Do 3 sets of 5-10 in each position.          Wall walk/crawl: With elbow straight, use your fingers to "crawl" up a wall or door frame as far as possible. Hold for 10-20 seconds. Do this exercise first crawling your fingers in front of you. Then do the exercise crawling your fingers out to the side. Do 3 sets of 5-10.        Shoulder isometrics: External Rotation: Stand with the involved side of your body against a wall. Bend your elbow 90 degrees. Push the back of your hand slowly into the wall. Hold for 5 seconds, and then relax. Do 3 sets of 10. Internal Rotation: Stand at a corner of a wall or in a door frame. Place your involved arm against the wall around the corner, bending your elbow 90 degrees. Push the palm of your hand into the wall. Hold for 5 seconds, and then relax. Do 3 sets of  10.    External Rotation:Internal Rotation:            Topical Agent Options    Muscle Rubs  These products are used to treat aches and pains of the muscles or joints. Some common examples include IcyHot, Salonpas, Biofreeze, Aspercreme, and Bengay.     Lidocaine-Based Products  These substances reduce pain by numbing superficial sensory nerve fibers. They come in forms of either ointments or patches    Topical NSAIDs  These products help reduce inflammation with minimal absorption of the drug into circulation. The two NSAIDS that are available to purchase in the [place] are: Diclofenac gel (Voltaren gel) and methyl salicylate patch (Salonpas Pain Relief Patch)    Capsaicin  Capsaicin works by blocking specific pain signals transmitted in the brain. It comes in many forms including, but not limited to, gel, patches, sticks, and creams.    CBD-based Products  These products are thought to help by reducing inflammation and pain signaling pathways.    Ice  Ice can decrease inflammation and help ease pain. Ice should be covered with a cloth - never place directly on skin. Apply for 15 minutes  on, then 1 hour off. This can be repeated as needed.    Heat  Heat can help muscles relax and increase circulation to areas to help induce healing. There are many ways to apply heat to an area such as heat packs, heat wraps, warm showers/baths etc. Caution should be used to avoid burning your skin.

## 2024-05-23 NOTE — Progress Notes (Addendum)
 error
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
## 2024-05-28 ENCOUNTER — Encounter: Payer: Self-pay | Admitting: Physical Medicine and Rehabilitation

## 2024-05-29 ENCOUNTER — Encounter: Payer: Self-pay | Admitting: Physical Medicine and Rehabilitation

## 2024-05-29 NOTE — Progress Notes (Signed)
 Patient of Dr. Juli.[facility](MRI CERVICAL SPINE W/OUT CONTRAST, CPT#72141)-AUTH#[id number] EFF 05/29/24-08/26/24

## 2024-05-29 NOTE — Progress Notes (Signed)
 Faxed to [facility] - sent patient MyChart message

## 2024-05-29 NOTE — Progress Notes (Signed)
 MR cervical spine without contrast, [facility]#[id number]

## 2024-06-05 ENCOUNTER — Other Ambulatory Visit
Admission: RE | Admit: 2024-06-05 | Discharge: 2024-06-05 | Disposition: A | Discharge status: Home / Self Care | Source: Ambulatory Visit | Attending: Gastroenterology | Admitting: Gastroenterology

## 2024-06-05 DIAGNOSIS — R1084 Generalized abdominal pain: Secondary | ICD-10-CM | POA: Insufficient documentation

## 2024-06-05 DIAGNOSIS — R110 Nausea: Secondary | ICD-10-CM | POA: Insufficient documentation

## 2024-06-05 LAB — HEPATIC FUNCTION PANEL
ALT: 17 U/L (ref 0–35)
AST: 20 U/L (ref 0–35)
Albumin: 4.7 g/dL (ref 3.5–5.2)
Alk Phos: 70 U/L (ref 35–105)
Bilirubin,Direct: <0.2 mg/dL (ref 0.0–0.3)
Bilirubin,Total: 0.2 mg/dL (ref 0.0–1.2)
Total Protein: 6.6 g/dL (ref 6.3–7.7)

## 2024-06-05 LAB — LIPASE: Lipase: 34 U/L (ref 13–60)

## 2024-06-05 LAB — AMYLASE: Amylase: 87 U/L (ref 28–100)

## 2024-06-06 ENCOUNTER — Ambulatory Visit: Payer: Self-pay | Admitting: Gastroenterology

## 2024-06-11 ENCOUNTER — Other Ambulatory Visit: Payer: Self-pay

## 2024-06-11 ENCOUNTER — Ambulatory Visit: Payer: Self-pay | Admitting: Obstetrics and Gynecology

## 2024-06-11 ENCOUNTER — Ambulatory Visit: Payer: Self-pay | Attending: Obstetrics | Admitting: Obstetrics and Gynecology

## 2024-06-11 VITALS — BP 104/68 | Ht 64.0 in | Wt 146.2 lb

## 2024-06-11 DIAGNOSIS — Z30431 Encounter for routine checking of intrauterine contraceptive device: Secondary | ICD-10-CM

## 2024-06-11 MED ORDER — DROSPIRENONE 4 MG PO TABS *A*
4.0000 mg | ORAL_TABLET | Freq: Every day | ORAL | 0 refills | Status: DC
Start: 2024-06-11 — End: 2024-07-13

## 2024-06-11 NOTE — Progress Notes (Signed)
 [facility] AssociatesChief Complaint: Follow-up (IUD Check- her last period lasted 12 days )Subjective: Caroline Blanchard is a 33 y.o. female G0P0000 with LMP of 05/28/2024 who presents to the office for routine IUD check. She had Paragard  IUD placed 04/2024. She is doing well with this method. She denies AUB, pelvic pain, pain with intercourse or concern that partner can feel the strings. She further denies vaginal discharge, itching, odor, difficulty urinating or having a bowel movement. She states her first cycle was normal. Her most recent cycle was long. States it was heavy for 4-5 days and then light for a total of 12 days. She states she had some back pain and cramping with the menses. Continues to have mid-cycle/ovulatory cramping and mood changes before her menses. At her last appointment we had discussed adding in either a low dose cOCP or Slynd. She would like to try Slynd.  Objective: BP 104/68  Ht 1.626 m (5' 4)   Wt 66.3 kg (146 lb 3.2 oz)   BMI 25.10 kg/m Physical Exam Constitutional: She appears well-developed and well-nourished. No distress. HENT: Head: Normocephalic and atraumatic. Pulmonary/Chest: Effort normal. Genitourinary:    External genitalia: normal appearance for age, no lesion(s)     Cervix: normal in appearance  no lesion(s)  IUD strings visualized 2-3 cm from os   Perineum: normal in appearance Psychiatric: She has a normal mood and affect.  Assessment: Pt is a 33yo F presenting for routine IUD check. She had Paragard  IUD placed back in September and is doing well with this method thus far. Strings visualized on exam today.  Plan:  Rx for [facility] sent. Aware this may not be covered by her insurance. If this is not cost effective we could send in a combination OCP instead. She verbalized understanding. Follow up as needed or for next AE. Caroline Cleaves, PA [facility]
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
## 2024-06-13 ENCOUNTER — Ambulatory Visit: Payer: Self-pay | Attending: Internal Medicine | Admitting: Gastroenterology

## 2024-06-13 ENCOUNTER — Encounter: Payer: Self-pay | Admitting: Gastroenterology

## 2024-06-13 ENCOUNTER — Telehealth: Payer: Self-pay

## 2024-06-13 ENCOUNTER — Other Ambulatory Visit: Payer: Self-pay

## 2024-06-13 VITALS — BP 106/71 | HR 86 | Temp 97.3°F | Ht 64.0 in | Wt 148.3 lb

## 2024-06-13 DIAGNOSIS — K9041 Non-celiac gluten sensitivity: Secondary | ICD-10-CM

## 2024-06-13 DIAGNOSIS — R195 Other fecal abnormalities: Secondary | ICD-10-CM

## 2024-06-13 DIAGNOSIS — R198 Other specified symptoms and signs involving the digestive system and abdomen: Secondary | ICD-10-CM

## 2024-06-13 DIAGNOSIS — R1013 Epigastric pain: Secondary | ICD-10-CM

## 2024-06-13 NOTE — Telephone Encounter (Signed)
 Spoke with Caroline Blanchard , patient, at check out: Patient was seen by Warren Schiller, NP today and the Provider's recommendations were: Follow up in 8 months. Scheduled all recommended appts as directed in check out comment.AVS provided to patient via MyChart.

## 2024-06-13 NOTE — Patient Instructions (Addendum)
-   Continue to avoid gluten - Update stool test with any [facility] lab - Complete blood test with any [facility] labINSTRUCTIONS FOR STOOL COLLECTIONObtain patient sticker from staff and apply to container OR write your name and DOB on the containerWrite date and time of collection on container's designated label.After obtaining sample and closing container tightly, place inside provided biohazard bag.Drop off sample at any [facility] lab.

## 2024-06-20 ENCOUNTER — Other Ambulatory Visit: Payer: Self-pay

## 2024-06-30 ENCOUNTER — Other Ambulatory Visit: Payer: Self-pay | Admitting: Gastroenterology

## 2024-07-07 ENCOUNTER — Other Ambulatory Visit: Admission: RE | Admit: 2024-07-07 | Discharge: 2024-07-07 | Disposition: A | Source: Ambulatory Visit

## 2024-07-07 ENCOUNTER — Other Ambulatory Visit
Admission: RE | Admit: 2024-07-07 | Discharge: 2024-07-07 | Disposition: A | Discharge status: Home / Self Care | Source: Ambulatory Visit | Attending: Gastroenterology | Admitting: Gastroenterology

## 2024-07-07 DIAGNOSIS — R198 Other specified symptoms and signs involving the digestive system and abdomen: Secondary | ICD-10-CM

## 2024-07-07 DIAGNOSIS — R195 Other fecal abnormalities: Secondary | ICD-10-CM | POA: Insufficient documentation

## 2024-07-07 DIAGNOSIS — R1013 Epigastric pain: Secondary | ICD-10-CM | POA: Insufficient documentation

## 2024-07-07 LAB — IGA: IgA: 112 mg/dL (ref 70–400)

## 2024-07-08 ENCOUNTER — Ambulatory Visit: Payer: Self-pay | Admitting: Family

## 2024-07-08 LAB — CALPROTECTIN, FECAL: Calprotectin,Fecal: 20.1 ug/g

## 2024-07-08 LAB — TISSUE TRANSGLUT,IGA: tTG,IgA: <0.5 U/mL (ref 0.0–14.9)

## 2024-07-11 ENCOUNTER — Ambulatory Visit: Payer: Self-pay | Admitting: Physical Medicine and Rehabilitation

## 2024-07-13 ENCOUNTER — Other Ambulatory Visit: Payer: Self-pay

## 2024-07-13 ENCOUNTER — Ambulatory Visit: Attending: Emergency Medicine | Admitting: Emergency Medicine

## 2024-07-13 VITALS — BP 118/67 | HR 79 | Temp 97.5°F | Resp 18

## 2024-07-13 DIAGNOSIS — N390 Urinary tract infection, site not specified: Secondary | ICD-10-CM | POA: Insufficient documentation

## 2024-07-13 LAB — POCT URINALYSIS DIPSTICK
Bilirubin,Ur: NEGATIVE
Blood,UA POCT: NEGATIVE
Glucose,UA POCT: NORMAL mg/dL
Ketones,UA POCT: NEGATIVE mg/dL
Leuk Esterase,UA POCT: 1 — AB
Lot #: 83627103
Nitrite,UA POCT: NEGATIVE
PH,UA POCT: 8 (ref 5–8)
Protein,UA POCT: NEGATIVE mg/dL
Specific gravity,UA POCT: 1.005 (ref 1.002–1.030)
Urobilinogen,UA: NORMAL mg/dL

## 2024-07-13 LAB — POCT URINE PREGNANCY
Lot #: 241074
Preg Test,UR POC: NEGATIVE

## 2024-07-13 MED ORDER — CEPHALEXIN 250 MG/5ML PO SUSR *I*
500.0000 mg | Freq: Two times a day (BID) | ORAL | 0 refills | Status: AC
Start: 2024-07-13 — End: 2024-07-18

## 2024-07-13 NOTE — UC Provider Note (Signed)
 Chief Complaint:  Chief Complaint Patient presents with  Urinary Symptoms   Monday started with urinary frequency, burning, discomfort.  Took at home test and was positive. History provided by: patientHistory limited by: n/aLanguage interpreter used: noHPI:33 y.o. female w hx as noted in chart presents to Urgent Care for urinary frequency and burning that started this previous week.  She took a home test which was positive for UTI so presents to urgent care.  No flank pain or fever.Symptoms endorsed:dysuria and frequencyDenies:bilateral flank painPatient concerned about pregnancy: NoPatient concerned about STIs: NoRecent urological procedures: NoVITALS:BP 118/67   Pulse 79   Temp 36.4 C (97.5 F)   Resp 18   SpO2 100% ROS: see HPI abovePhysical Exam:Appearance: Well appearing, no acute distress CV: normal ratePulm: no acute respiratory distressAbdomen: Soft, nondistended, normoactive bowel sounds, Tenderness to palpation: no, CVA tenderness: noSkin: no pallor or rash MEDICAL DECISION MAKING:Assessment / Plan: 33 y.o. female w hx as noted in chart presents to UC with urinary tract infection symptoms.Differential Diagnosis: Acute cystitis, pyelonephritis, overactive bladder, interstitial cystitis, STI Orders / Results: Orders Placed This Encounter  Aerobic bacterial urine culture  POCT Urinalysis Dipstick  POCT urine pregnancy  cephalexin (KEFLEX) 50 mg/mL suspension Results for orders placed or performed in visit on 07/13/24 POCT Urinalysis Dipstick Result Value Ref Range  Specific gravity,UA POCT 1.005 1.002 - 1.030  PH,UA POCT 8.0 5 - 8  Leuk Esterase,UA POCT +1 (!) Negative, Test Not Performed  Nitrite,UA POCT Negative Negative, Test Not Performed  Protein,UA POCT Negative Negative, Test Not Performed mg/dL  Glucose,UA POCT Normal Normal mg/dL  Ketones,UA POCT Negative Negative mg/dL   Urobilinogen,UA Normal Less than 1 mg/dL  Bilirubin,Ur Negative Negative, Test Not Performed  Blood,UA POCT Negative Negative, Test Not Performed  Exp date 5/26   Lot # [id number]  POCT urine pregnancy Result Value Ref Range  Preg Test,UR POC Negative Negative-Dilute urine specimens may cause false negative urine pregnancy results...  INTERNAL CONTROL POCT URINE PREGNANCY *Yes-internal procedural control(s) acceptable   Exp date 03/11/25   Lot # [id number]  UA just shows leukocyte esterase but given her symptoms and home testing we will presume a UTI and treat with oral Keflex.  Patient requested liquid antibiotics therefore we gave her liquid antibiotics for 5 days.Diagnosis and Disposition:1. UTI (urinary tract infection)  Please follow up with your physician as below:No follow-ups on file.

## 2024-07-14 ENCOUNTER — Encounter: Payer: Self-pay | Admitting: Orthopedic Surgery

## 2024-07-14 LAB — AEROBIC BACTERIAL URINE CULTURE: Aerobic bacterial urine culture: 0

## 2024-07-16 ENCOUNTER — Encounter: Payer: Self-pay | Admitting: Liver Pancreas GI Surgery

## 2024-07-22 ENCOUNTER — Ambulatory Visit: Payer: Self-pay | Admitting: Sports Medicine

## 2024-07-22 DIAGNOSIS — M25511 Pain in right shoulder: Secondary | ICD-10-CM

## 2024-08-01 ENCOUNTER — Ambulatory Visit: Payer: Self-pay | Admitting: Sports Medicine

## 2024-08-07 ENCOUNTER — Encounter: Payer: Self-pay | Admitting: Obstetrics and Gynecology

## 2024-08-08 NOTE — Addendum Note (Signed)
 Addended byBETHA CHESTER PA on: 08/08/2024 03:07 PM Modules accepted: Orders

## 2024-08-11 MED ORDER — ETONOGESTREL-ETHINYL ESTRADIOL 0.12-0.015 MG/24HR VA RING *I*: 1.0000 | VAGINAL_RING | VAGINAL | 3 refills | Status: AC

## 2024-08-11 NOTE — Addendum Note (Signed)
 Addended by: TERESA JINNIE HERO on: 08/11/2024 08:04 AM Modules accepted: Orders

## 2024-08-26 ENCOUNTER — Other Ambulatory Visit: Payer: Self-pay | Admitting: Physical Medicine and Rehabilitation

## 2024-09-02 ENCOUNTER — Other Ambulatory Visit
Admission: RE | Admit: 2024-09-02 | Discharge: 2024-09-02 | Disposition: A | Discharge status: Home / Self Care | Source: Ambulatory Visit | Attending: Neurology | Admitting: Neurology

## 2024-09-02 ENCOUNTER — Other Ambulatory Visit: Payer: Self-pay | Admitting: Gastroenterology

## 2024-09-02 DIAGNOSIS — E611 Iron deficiency: Secondary | ICD-10-CM | POA: Insufficient documentation

## 2024-09-02 LAB — CBC AND DIFFERENTIAL
Baso # K/uL: 0.1 THOU/uL (ref 0.0–0.2)
Eos # K/uL: 0.1 THOU/uL (ref 0.0–0.5)
Hematocrit: 41 % (ref 34–49)
Hemoglobin: 13.6 g/dL (ref 11.2–16.0)
IMM Granulocytes #: 0 THOU/uL (ref 0–0)
IMM Granulocytes: 0.2 %
Lymph # K/uL: 2.1 THOU/uL (ref 1.0–5.0)
MCV: 91 fL (ref 75–100)
Mono # K/uL: 0.5 THOU/uL (ref 0.1–1.0)
Neut # K/uL: 3 THOU/uL (ref 1.5–6.5)
Nucl RBC # K/uL: 0 THOU/uL
Nucl RBC %: 0 /100{WBCs} (ref 0.0–0.2)
Platelets: 244 THOU/uL (ref 150–450)
RBC: 4.5 MIL/uL (ref 4.0–5.5)
RDW: 11.9 % (ref 0.0–15.0)
Seg Neut %: 52.8 %
WBC: 5.8 THOU/uL (ref 3.5–11.0)

## 2024-09-02 LAB — TIBC
Iron: 92 ug/dL (ref 34–165)
TIBC: 362 ug/dL (ref 250–450)
Transferrin Saturation: 25 % (ref 15–50)

## 2024-09-02 LAB — FERRITIN: Ferritin: 19 ng/mL (ref 10–120)

## 2024-09-02 LAB — TRANSFERRIN: Transferrin: 268 mg/dL (ref 200–360)

## 2025-01-28 ENCOUNTER — Ambulatory Visit: Admitting: Gastroenterology
